# Patient Record
Sex: Female | Born: 1950
Health system: Southern US, Community
[De-identification: ages and names within clinical notes are randomized; demographics above are authoritative.]

## PROBLEM LIST (undated history)

## (undated) DIAGNOSIS — E785 Hyperlipidemia, unspecified: Secondary | ICD-10-CM

## (undated) DIAGNOSIS — C801 Malignant (primary) neoplasm, unspecified: Secondary | ICD-10-CM

## (undated) DIAGNOSIS — I10 Essential (primary) hypertension: Secondary | ICD-10-CM

## (undated) HISTORY — DX: Malignant (primary) neoplasm, unspecified: C80.1

## (undated) HISTORY — PX: EYE SURGERY: SHX253

## (undated) HISTORY — DX: Hyperlipidemia, unspecified: E78.5

## (undated) HISTORY — PX: BREAST SURGERY: SHX581

## (undated) HISTORY — PX: ABDOMINAL HYSTERECTOMY: SHX81

---

## 2020-02-11 ENCOUNTER — Ambulatory Visit: Payer: Medicare Other | Admitting: Nurse Practitioner

## 2020-03-03 ENCOUNTER — Ambulatory Visit (INDEPENDENT_AMBULATORY_CARE_PROVIDER_SITE_OTHER): Payer: Medicare Other | Admitting: Family Medicine

## 2020-03-03 ENCOUNTER — Other Ambulatory Visit: Payer: Self-pay

## 2020-03-03 ENCOUNTER — Encounter: Payer: Self-pay | Admitting: Family Medicine

## 2020-03-03 VITALS — BP 100/60 | HR 60 | Temp 98.2°F | Ht 66.0 in | Wt 242.6 lb

## 2020-03-03 DIAGNOSIS — Z0000 Encounter for general adult medical examination without abnormal findings: Secondary | ICD-10-CM | POA: Diagnosis not present

## 2020-03-03 DIAGNOSIS — R011 Cardiac murmur, unspecified: Secondary | ICD-10-CM

## 2020-03-03 DIAGNOSIS — M810 Age-related osteoporosis without current pathological fracture: Secondary | ICD-10-CM

## 2020-03-03 DIAGNOSIS — Z1231 Encounter for screening mammogram for malignant neoplasm of breast: Secondary | ICD-10-CM

## 2020-03-03 DIAGNOSIS — R300 Dysuria: Secondary | ICD-10-CM

## 2020-03-03 DIAGNOSIS — E7800 Pure hypercholesterolemia, unspecified: Secondary | ICD-10-CM

## 2020-03-03 NOTE — Progress Notes (Signed)
Subjective:    Patient ID: Christina Phillips, female    DOB: Dec 29, 1950, 69 y.o.   MRN: [id number]  HPI Patient is a very pleasant 69 year old Caucasian female who presents today to establish care.  She states that she had a history of breast cancer originally treated when she was in her late 90s.  It was a treated originally in her right breast with a lumpectomy and radiation.  However it returned and she underwent a right-sided mastectomy.  She also took Arimidex for 6 years.  However she has been off Arimidex now for more than a year.  She is due for her mammogram.  She also has never had a colonoscopy.  She declines a colonoscopy but she would consent to Cologuard.  Past medical history is also significant for a total abdominal hysterectomy for a "suspicious lesion".  Patient is not sure if it was cancerous however her previous gynecologist was monitoring a lesion inside her vagina annually with a pelvic exam.  She would like to establish with a gynecologist here.  She also has a history of osteoporosis and is currently on Prolia twice yearly.  She is overdue for her most recent Prolia injection as well as a bone density test. Past Medical History:  Diagnosis Date  . Cancer [facility])    breast right, mastectomy after recurrence from lumpectomy/radiation  . Hyperlipidemia    Osteoporosis  PSH- r mastectomy TAH  No family history on file.  Social History   Socioeconomic History  . Marital status: Divorced    Spouse name: Not on file  . Number of children: Not on file  . Years of education: Not on file  . Highest education level: Not on file  Occupational History  . Not on file  Tobacco Use  . Smoking status: Former Research scientist (life sciences)  . Smokeless tobacco: Never Used  Substance and Sexual Activity  . Alcohol use: Never  . Drug use: Never  . Sexual activity: Not on file  Other Topics Concern  . Not on file  Social History Narrative  . Not on file   Social Determinants of Health    Financial Resource Strain:   . Difficulty of Paying Living Expenses: Not on file  Food Insecurity:   . Worried About Charity fundraiser in the Last Year: Not on file  . Ran Out of Food in the Last Year: Not on file  Transportation Needs:   . Lack of Transportation (Medical): Not on file  . Lack of Transportation (Non-Medical): Not on file  Physical Activity:   . Days of Exercise per Week: Not on file  . Minutes of Exercise per Session: Not on file  Stress:   . Feeling of Stress : Not on file  Social Connections:   . Frequency of Communication with Friends and Family: Not on file  . Frequency of Social Gatherings with Friends and Family: Not on file  . Attends Religious Services: Not on file  . Active Member of Clubs or Organizations: Not on file  . Attends Archivist Meetings: Not on file  . Marital Status: Not on file  Intimate Partner Violence:   . Fear of Current or Ex-Partner: Not on file  . Emotionally Abused: Not on file  . Physically Abused: Not on file  . Sexually Abused: Not on file      Review of Systems  All other systems reviewed and are negative.      Objective:   Physical Exam Vitals reviewed.  Constitutional:      General: She is not in acute distress.    Appearance: Normal appearance. She is obese. She is not ill-appearing, toxic-appearing or diaphoretic.  HENT:     Head: Normocephalic and atraumatic.     Right Ear: Tympanic membrane, ear canal and external ear normal. There is no impacted cerumen.     Left Ear: Tympanic membrane, ear canal and external ear normal. There is no impacted cerumen.     Nose: Nose normal. No congestion or rhinorrhea.     Mouth/Throat:     Mouth: Mucous membranes are moist.     Pharynx: Oropharynx is clear. No oropharyngeal exudate or posterior oropharyngeal erythema.  Eyes:     General: No scleral icterus.       Right eye: No discharge.        Left eye: No discharge.     Extraocular Movements: Extraocular  movements intact.     Conjunctiva/sclera: Conjunctivae normal.     Pupils: Pupils are equal, round, and reactive to light.  Neck:     Vascular: No carotid bruit.  Cardiovascular:     Rate and Rhythm: Normal rate and regular rhythm.     Heart sounds: Murmur heard.  No friction rub. No gallop.   Pulmonary:     Effort: Pulmonary effort is normal. No respiratory distress.     Breath sounds: Normal breath sounds. No stridor. No wheezing, rhonchi or rales.  Chest:     Chest wall: No tenderness.  Abdominal:     General: Abdomen is flat. Bowel sounds are normal. There is no distension.     Palpations: Abdomen is soft. There is no mass.     Tenderness: There is no abdominal tenderness. There is no right CVA tenderness, left CVA tenderness, guarding or rebound.     Hernia: No hernia is present.  Musculoskeletal:     Cervical back: Normal range of motion and neck supple.     Right lower leg: No edema.     Left lower leg: No edema.  Lymphadenopathy:     Cervical: No cervical adenopathy.  Skin:    General: Skin is warm.     Coloration: Skin is not jaundiced or pale.     Findings: No bruising, erythema, lesion or rash.  Neurological:     General: No focal deficit present.     Mental Status: She is alert and oriented to person, place, and time. Mental status is at baseline.     Cranial Nerves: No cranial nerve deficit.     Sensory: No sensory deficit.     Motor: No weakness.     Coordination: Coordination normal.     Gait: Gait normal.     Deep Tendon Reflexes: Reflexes normal.  Psychiatric:        Mood and Affect: Mood normal.        Behavior: Behavior normal.        Thought Content: Thought content normal.        Judgment: Judgment normal.           Assessment & Plan:  Encounter for screening mammogram for malignant neoplasm of breast - Plan: MM Digital Screening  Osteoporosis, unspecified osteoporosis type, unspecified pathological fracture presence - Plan: DG Bone  Density  General medical exam - Plan: Ambulatory referral to Gynecology  Dysuria - Plan: Urinalysis, Routine w reflex microscopic  Pure hypercholesterolemia - Plan: CBC with Differential/Platelet, COMPLETE METABOLIC PANEL WITH GFR, Lipid panel  Murmur, cardiac - Plan:  ECHOCARDIOGRAM COMPLETE  Patient has an undiagnosed cardiac murmur.  I will schedule her for an echocardiogram to evaluate further.  She has a history of osteoporosis.  I will schedule her for a bone density to obtain a baseline and then schedule her for Prolia every 6 months.  I will schedule the patient for a mammogram.  She would also like to meet with a gynecologist for a pelvic exam however she does not require a Pap smear.  Check CBC, CMP, fasting lipid panel.  Patient does not want a colonoscopy but will consent to Cologuard.  She denies any history of falls, depression, or memory loss.

## 2020-03-03 NOTE — Patient Instructions (Signed)
° ° ° °  If you have lab work done today you will be contacted with your lab results within the next 2 weeks.  If you have not heard from us then please contact us. The fastest way to get your results is to register for My Chart. ° ° °IF you received an x-ray today, you will receive an invoice from [facility]. Please contact  Radiology at [phone] with questions or concerns regarding your invoice.  ° °IF you received labwork today, you will receive an invoice from [facility]. Please contact [facility] at [phone] with questions or concerns regarding your invoice.  ° °Our billing staff will not be able to assist you with questions regarding bills from these companies. ° °You will be contacted with the lab results as soon as they are available. The fastest way to get your results is to activate your My Chart account. Instructions are located on the last page of this paperwork. If you have not heard from us regarding the results in 2 weeks, please contact this office. °  ° ° ° °

## 2020-03-04 LAB — COMPLETE METABOLIC PANEL WITH GFR
AG Ratio: 1.9 (calc) (ref 1.0–2.5)
ALT: 16 U/L (ref 6–29)
AST: 17 U/L (ref 10–35)
Albumin: 4.6 g/dL (ref 3.6–5.1)
Alkaline phosphatase (APISO): 57 U/L (ref 37–153)
BUN: 18 mg/dL (ref 7–25)
CO2: 28 mmol/L (ref 20–32)
Calcium: 10.3 mg/dL (ref 8.6–10.4)
Chloride: 100 mmol/L (ref 98–110)
Creat: 0.85 mg/dL (ref 0.50–0.99)
GFR, Est African American: 82 mL/min/{1.73_m2} (ref >=60)
GFR, Est Non African American: 71 mL/min/{1.73_m2} (ref >=60)
Globulin: 2.4 g/dL (calc) (ref 1.9–3.7)
Glucose, Bld: 72 mg/dL (ref 65–99)
Potassium: 4.3 mmol/L (ref 3.5–5.3)
Sodium: 140 mmol/L (ref 135–146)
Total Bilirubin: 0.5 mg/dL (ref 0.2–1.2)
Total Protein: 7 g/dL (ref 6.1–8.1)

## 2020-03-04 LAB — URINALYSIS, ROUTINE W REFLEX MICROSCOPIC
Bilirubin Urine: NEGATIVE
Glucose, UA: NEGATIVE
Hgb urine dipstick: NEGATIVE
Ketones, ur: NEGATIVE
Leukocytes,Ua: NEGATIVE
Nitrite: NEGATIVE
Protein, ur: NEGATIVE
Specific Gravity, Urine: 1.011 (ref 1.001–1.03)
pH: 7 (ref 5.0–8.0)

## 2020-03-04 LAB — LIPID PANEL
Cholesterol: 186 mg/dL (ref <200)
HDL: 49 mg/dL — ABNORMAL LOW (ref >=50)
LDL Cholesterol (Calc): 105 mg/dL (calc) — ABNORMAL HIGH
Non-HDL Cholesterol (Calc): 137 mg/dL (calc) — ABNORMAL HIGH (ref <130)
Total CHOL/HDL Ratio: 3.8 (calc) (ref <5.0)
Triglycerides: 199 mg/dL — ABNORMAL HIGH (ref <150)

## 2020-03-04 LAB — CBC WITH DIFFERENTIAL/PLATELET
Absolute Monocytes: 470 cells/uL (ref 200–950)
Basophils Absolute: 41 cells/uL (ref 0–200)
Basophils Relative: 0.7 %
Eosinophils Absolute: 151 cells/uL (ref 15–500)
Eosinophils Relative: 2.6 %
HCT: 44.6 % (ref 35.0–45.0)
Hemoglobin: 14.8 g/dL (ref 11.7–15.5)
Lymphs Abs: 1607 cells/uL (ref 850–3900)
MCH: 30.5 pg (ref 27.0–33.0)
MCHC: 33.2 g/dL (ref 32.0–36.0)
MCV: 91.8 fL (ref 80.0–100.0)
MPV: 10.3 fL (ref 7.5–12.5)
Monocytes Relative: 8.1 %
Neutro Abs: 3532 cells/uL (ref 1500–7800)
Neutrophils Relative %: 60.9 %
Platelets: 229 10*3/uL (ref 140–400)
RBC: 4.86 10*6/uL (ref 3.80–5.10)
RDW: 12.3 % (ref 11.0–15.0)
Total Lymphocyte: 27.7 %
WBC: 5.8 10*3/uL (ref 3.8–10.8)

## 2020-03-18 ENCOUNTER — Other Ambulatory Visit: Payer: Self-pay

## 2020-03-18 ENCOUNTER — Ambulatory Visit (HOSPITAL_COMMUNITY): Payer: Medicare Other | Attending: Cardiovascular Disease

## 2020-03-18 DIAGNOSIS — R011 Cardiac murmur, unspecified: Secondary | ICD-10-CM | POA: Diagnosis present

## 2020-03-18 LAB — ECHOCARDIOGRAM COMPLETE
Area-P 1/2: 4.06 cm2
S' Lateral: 2.9 cm

## 2020-03-27 ENCOUNTER — Encounter: Payer: Medicare Other | Admitting: Obstetrics & Gynecology

## 2020-04-04 ENCOUNTER — Telehealth: Payer: Self-pay

## 2020-04-04 ENCOUNTER — Other Ambulatory Visit: Payer: Self-pay

## 2020-04-04 DIAGNOSIS — E7800 Pure hypercholesterolemia, unspecified: Secondary | ICD-10-CM

## 2020-04-04 MED ORDER — PRAVASTATIN SODIUM 10 MG PO TABS: 10.0000 mg | ORAL_TABLET | Freq: Every day | ORAL | 3 refills | Status: DC

## 2020-04-04 NOTE — Telephone Encounter (Signed)
New rx Provider prescribed has been sent to Pt pharmacy

## 2020-04-04 NOTE — Telephone Encounter (Signed)
Pt would like to try another cholesterol medication, she's not doing well with the side affects.  Left arm pain is the only thing bother her.

## 2020-04-04 NOTE — Telephone Encounter (Signed)
Sure, she can try pravastatin 10 mg a day.

## 2020-04-15 ENCOUNTER — Other Ambulatory Visit: Payer: Self-pay

## 2020-04-15 ENCOUNTER — Ambulatory Visit (INDEPENDENT_AMBULATORY_CARE_PROVIDER_SITE_OTHER): Payer: Medicare Other | Admitting: Obstetrics & Gynecology

## 2020-04-15 ENCOUNTER — Encounter: Payer: Self-pay | Admitting: Obstetrics & Gynecology

## 2020-04-15 VITALS — BP 160/94 | HR 80 | Ht 66.0 in | Wt 246.5 lb

## 2020-04-15 DIAGNOSIS — Z01419 Encounter for gynecological examination (general) (routine) without abnormal findings: Secondary | ICD-10-CM | POA: Diagnosis not present

## 2020-04-15 NOTE — Progress Notes (Signed)
Subjective:     Christina Phillips is a 69 y.o. female here for a routine exam.  No LMP recorded. Patient has had a hysterectomy. [id number] Birth Control Method:  hysterectomy Menstrual Calendar(currently): n/a  Current complaints: none.   Current acute medical issues:  none   Recent Gynecologic History No LMP recorded. Patient has had a hysterectomy. Last Pap: n/a,   Last mammogram: 2020,    Past Medical History:  Diagnosis Date  . Cancer [facility])    breast right, mastectomy after recurrence from lumpectomy/radiation  . Hyperlipidemia     Past Surgical History:  Procedure Laterality Date  . ABDOMINAL HYSTERECTOMY N/A    suspicious lesion  . BREAST SURGERY N/A    Phreesia 02/29/2020    OB History    Gravida  3   Para  2   Term  2   Preterm      AB  1   Living  2     SAB  1   TAB      Ectopic      Multiple      Live Births  2           Social History   Socioeconomic History  . Marital status: Divorced    Spouse name: Not on file  . Number of children: 2  . Years of education: Not on file  . Highest education level: Not on file  Occupational History  . Not on file  Tobacco Use  . Smoking status: Former Research scientist (life sciences)  . Smokeless tobacco: Never Used  Vaping Use  . Vaping Use: Never used  Substance and Sexual Activity  . Alcohol use: Yes    Comment: occas  . Drug use: Never  . Sexual activity: Not Currently    Birth control/protection: Surgical  Other Topics Concern  . Not on file  Social History Narrative  . Not on file   Social Determinants of Health   Financial Resource Strain: Medium Risk  . Difficulty of Paying Living Expenses: Somewhat hard  Food Insecurity: No Food Insecurity  . Worried About Charity fundraiser in the Last Year: Never true  . Ran Out of Food in the Last Year: Never true  Transportation Needs: No Transportation Needs  . Lack of Transportation (Medical): No  . Lack of Transportation (Non-Medical): No  Physical  Activity: Sufficiently Active  . Days of Exercise per Week: 7 days  . Minutes of Exercise per Session: 40 min  Stress: No Stress Concern Present  . Feeling of Stress : Only a little  Social Connections: Socially Isolated  . Frequency of Communication with Friends and Family: Once a week  . Frequency of Social Gatherings with Friends and Family: Once a week  . Attends Religious Services: 1 to 4 times per year  . Active Member of Clubs or Organizations: No  . Attends Archivist Meetings: Never  . Marital Status: Divorced    Family History  Problem Relation Age of Onset  . Cancer Father   . Hypertension Mother   . Cancer Brother   . Heart attack Sister      Current Outpatient Medications:  .  atorvastatin (LIPITOR) 10 MG tablet, Take 10 mg by mouth daily., Disp: , Rfl:  .  Multiple Vitamins-Minerals (CENTRUM ADULTS PO), Take by mouth., Disp: , Rfl:   Review of Systems  Review of Systems  Constitutional: Negative for fever, chills, weight loss, malaise/fatigue and diaphoresis.  HENT: Negative for hearing loss, ear pain,  nosebleeds, congestion, sore throat, neck pain, tinnitus and ear discharge.   Eyes: Negative for blurred vision, double vision, photophobia, pain, discharge and redness.  Respiratory: Negative for cough, hemoptysis, sputum production, shortness of breath, wheezing and stridor.   Cardiovascular: Negative for chest pain, palpitations, orthopnea, claudication, leg swelling and PND.  Gastrointestinal: negative for abdominal pain. Negative for heartburn, nausea, vomiting, diarrhea, constipation, blood in stool and melena.  Genitourinary: Negative for dysuria, urgency, frequency, hematuria and flank pain.  Musculoskeletal: Negative for myalgias, back pain, joint pain and falls.  Skin: Negative for itching and rash.  Neurological: Negative for dizziness, tingling, tremors, sensory change, speech change, focal weakness, seizures, loss of consciousness, weakness  and headaches.  Endo/Heme/Allergies: Negative for environmental allergies and polydipsia. Does not bruise/bleed easily.  Psychiatric/Behavioral: Negative for depression, suicidal ideas, hallucinations, memory loss and substance abuse. The patient is not nervous/anxious and does not have insomnia.        Objective:  Blood pressure (!) 160/94, pulse 80, height 5\' 6"  (1.676 m), weight 246 lb 8 oz (111.8 kg).   Physical Exam  Vitals reviewed. Constitutional: She is oriented to person, place, and time. She appears well-developed and well-nourished.  HENT:  Head: Normocephalic and atraumatic.        Right Ear: External ear normal.  Left Ear: External ear normal.  Nose: Nose normal.  Mouth/Throat: Oropharynx is clear and moist.  Eyes: Conjunctivae and EOM are normal. Pupils are equal, round, and reactive to light. Right eye exhibits no discharge. Left eye exhibits no discharge. No scleral icterus.  Neck: Normal range of motion. Neck supple. No tracheal deviation present. No thyromegaly present.  Cardiovascular: Normal rate, regular rhythm, normal heart sounds and intact distal pulses.  Exam reveals no gallop and no friction rub.   No murmur heard. Respiratory: Effort normal and breath sounds normal. No respiratory distress. She has no wheezes. She has no rales. She exhibits no tenderness.  GI: Soft. Bowel sounds are normal. She exhibits no distension and no mass. There is no tenderness. There is no rebound and no guarding.  Genitourinary:  Breasts no masses skin changes or nipple changes bilaterally      Vulva is normal without lesions Vagina is pink moist without discharge Cervix absent Uterus is negative Adnexa is negative  {Rectal    hemoccult negative, normal tone, no masses  Musculoskeletal: Normal range of motion. She exhibits no edema and no tenderness.  Neurological: She is alert and oriented to person, place, and time. She has normal reflexes. She displays normal reflexes. No  cranial nerve deficit. She exhibits normal muscle tone. Coordination normal.  Skin: Skin is warm and dry. No rash noted. No erythema. No pallor.  Psychiatric: She has a normal mood and affect. Her behavior is normal. Judgment and thought content normal.       Medications Ordered at today's visit: No orders of the defined types were placed in this encounter.   Other orders placed at today's visit: No orders of the defined types were placed in this encounter.     Assessment:    Normal Gyn exam.    Plan:    Mammogram ordered. Follow up in: 3 years.     Return in about 3 years (around 04/16/2023) for yearly.

## 2020-06-10 ENCOUNTER — Ambulatory Visit (INDEPENDENT_AMBULATORY_CARE_PROVIDER_SITE_OTHER): Payer: Medicare Other

## 2020-06-10 ENCOUNTER — Other Ambulatory Visit: Payer: Self-pay

## 2020-06-10 ENCOUNTER — Ambulatory Visit
Admission: EM | Admit: 2020-06-10 | Discharge: 2020-06-10 | Disposition: A | Discharge status: Home / Self Care | Payer: Medicare Other | Attending: Emergency Medicine | Admitting: Emergency Medicine

## 2020-06-10 DIAGNOSIS — M25551 Pain in right hip: Secondary | ICD-10-CM

## 2020-06-10 DIAGNOSIS — W19XXXA Unspecified fall, initial encounter: Secondary | ICD-10-CM

## 2020-06-10 DIAGNOSIS — M5441 Lumbago with sciatica, right side: Secondary | ICD-10-CM

## 2020-06-10 MED ORDER — NAPROXEN 500 MG PO TABS: 500.0000 mg | ORAL_TABLET | Freq: Two times a day (BID) | ORAL | 0 refills | Status: DC

## 2020-06-10 MED ORDER — TIZANIDINE HCL 2 MG PO TABS: 2.0000 mg | ORAL_TABLET | Freq: Four times a day (QID) | ORAL | 0 refills | Status: AC | PRN

## 2020-06-10 NOTE — ED Provider Notes (Signed)
[facility]    CSN: [id number] Arrival date & time: 06/10/20  1332      History   Chief Complaint Chief Complaint  Patient presents with  . Back Pain    HPI Christina Phillips is a 70 y.o. female  With history as below presenting for right low back pain with some sciatica down to her mid thigh.  States that she fell on ice last Thursday.  No head trauma, LOC.  Denies known history of osteoporosis or osteopenia.  Is s/p right lumpectomy and radiation.  No active therapy at this time.  Requesting x-ray.  Past Medical History:  Diagnosis Date  . Cancer [facility])    breast right, mastectomy after recurrence from lumpectomy/radiation  . Hyperlipidemia     There are no problems to display for this patient.   Past Surgical History:  Procedure Laterality Date  . ABDOMINAL HYSTERECTOMY N/A    suspicious lesion  . BREAST SURGERY N/A    [employer]reesia 02/29/2020    OB History    Gravida  3   Para  2   Term  2   Preterm      AB  1   Living  2     SAB  1   IAB      Ectopic      Multiple      Live Births  2            Home Medications    Prior to Admission medications   Medication Sig Start Date End Date Taking? Authorizing Provider  naproxen (NAPROSYN) 500 MG tablet Take 1 tablet (500 mg total) by mouth 2 (two) times daily. 06/10/20  Yes Hall-Potvin, [place], PA-C  tiZANidine (ZANAFLEX) 2 MG tablet Take 1 tablet (2 mg total) by mouth every 6 (six) hours as needed for up to 5 days for muscle spasms. 06/10/20 06/15/20 Yes Hall-Potvin, [place], PA-C  atorvastatin (LIPITOR) 10 MG tablet Take 10 mg by mouth daily.    [provider]  Multiple Vitamins-Minerals (CENTRUM ADULTS PO) Take by mouth.    [provider]    Family History Family History  Problem Relation Age of Onset  . Cancer Father   . Hypertension Mother   . Cancer Brother   . Heart attack Sister     Social History Social History   Tobacco Use  . Smoking  status: Former Research scientist (life sciences)  . Smokeless tobacco: Never Used  Vaping Use  . Vaping Use: Never used  Substance Use Topics  . Alcohol use: Yes    Comment: occas  . Drug use: Never     Allergies   Patient has no known allergies.   Review of Systems Review of Systems  Constitutional: Negative for fatigue and fever.  HENT: Negative for ear pain, sinus pain, sore throat and voice change.   Eyes: Negative for pain, redness and visual disturbance.  Respiratory: Negative for cough and shortness of breath.   Cardiovascular: Negative for chest pain and palpitations.  Gastrointestinal: Negative for abdominal pain, diarrhea and vomiting.  Musculoskeletal: Positive for back pain. Negative for arthralgias and myalgias.  Skin: Negative for rash and wound.  Neurological: Negative for syncope and headaches.     Physical Exam Triage Vital Signs ED Triage Vitals [06/10/20 1419]  Enc Vitals Group     BP (!) 179/98     Pulse Rate 89     Resp 18     Temp 98.5 F (36.9 C)     Temp  Source Oral     SpO2 96 %     Weight      Height      Head Circumference      Peak Flow      Pain Score 6     Pain Loc      Pain Edu?      Excl. in [place]?    No data found.  Updated Vital Signs BP (!) 179/98 (BP Location: Left Arm)   Pulse 89   Temp 98.5 F (36.9 C) (Oral)   Resp 18   SpO2 96%   Visual Acuity Right Eye Distance:   Left Eye Distance:   Bilateral Distance:    Right Eye Near:   Left Eye Near:    Bilateral Near:     Physical Exam Constitutional:      General: She is not in acute distress. HENT:     Head: Normocephalic and atraumatic.  Eyes:     General: No scleral icterus.    Pupils: Pupils are equal, round, and reactive to light.  Cardiovascular:     Rate and Rhythm: Normal rate.  Pulmonary:     Effort: Pulmonary effort is normal.  Musculoskeletal:       Back:  Skin:    Coloration: Skin is not jaundiced or pale.  Neurological:     Mental Status: She is alert and oriented to  person, place, and time.      UC Treatments / Results  Labs (all labs ordered are listed, but only abnormal results are displayed) Labs Reviewed - No data to display  EKG   Radiology DG Hip Unilat With Pelvis 2-3 Views Right  Result Date: 06/10/2020 CLINICAL DATA:  Right hip pain after almost falling on 06/05/2020 EXAM: DG HIP (WITH OR WITHOUT PELVIS) 2-3V RIGHT COMPARISON:  None FINDINGS: There is no evidence of hip fracture or dislocation. There is no evidence of arthropathy or other focal bone abnormality. IMPRESSION: Negative. Electronically Signed   By: Kerby Moors M.D.   On: 06/10/2020 15:04    Procedures Procedures (including critical care time)  Medications Ordered in UC Medications - No data to display  Initial Impression / Assessment and Plan / UC Course  I have reviewed the triage vital signs and the nursing notes.  Pertinent labs & imaging results that were available during my care of the patient were reviewed by me and considered in my medical decision making (see chart for details).     X-ray negative, will treat supportively as below, follow-up with Ortho if needed.  Return precautions discussed, pt verbalized understanding and is agreeable to plan. Final Clinical Impressions(s) / UC Diagnoses   Final diagnoses:  Fall  Acute right-sided low back pain with right-sided sciatica   Discharge Instructions   None    ED Prescriptions    Medication Sig Dispense Auth. Provider   naproxen (NAPROSYN) 500 MG tablet Take 1 tablet (500 mg total) by mouth 2 (two) times daily. 30 tablet Hall-Potvin, [place], PA-C   tiZANidine (ZANAFLEX) 2 MG tablet Take 1 tablet (2 mg total) by mouth every 6 (six) hours as needed for up to 5 days for muscle spasms. 20 tablet Hall-Potvin, [place], PA-C     PDMP not reviewed this encounter.   Hall-Potvin, [place], [place] 06/10/20 1523

## 2020-06-10 NOTE — ED Triage Notes (Signed)
Pt states slipped on ice last Thursday not falling but pulled a muscle. Pt c/o rt side lower back pain radiating to rt hip.

## 2020-06-11 ENCOUNTER — Telehealth: Payer: Self-pay | Admitting: Family Medicine

## 2020-06-11 NOTE — Telephone Encounter (Signed)
Called and spoke with Pt. She said that she went to [facility] and does not need to be seen.

## 2020-06-11 NOTE — Telephone Encounter (Signed)
Pt called saying that she threw her back out, wants to call and schedule an appt with Dr. Dennard Schaumann  cb [phone]

## 2020-07-04 ENCOUNTER — Other Ambulatory Visit: Payer: Self-pay | Admitting: Family Medicine

## 2020-07-08 ENCOUNTER — Encounter: Payer: Self-pay | Admitting: Emergency Medicine

## 2020-07-08 ENCOUNTER — Other Ambulatory Visit: Payer: Self-pay

## 2020-07-08 ENCOUNTER — Ambulatory Visit (INDEPENDENT_AMBULATORY_CARE_PROVIDER_SITE_OTHER): Payer: Medicare Other

## 2020-07-08 ENCOUNTER — Ambulatory Visit
Admission: EM | Admit: 2020-07-08 | Discharge: 2020-07-08 | Disposition: A | Discharge status: Home / Self Care | Payer: Medicare Other | Attending: Family Medicine | Admitting: Family Medicine

## 2020-07-08 ENCOUNTER — Ambulatory Visit: Payer: Medicare Other

## 2020-07-08 DIAGNOSIS — M25561 Pain in right knee: Secondary | ICD-10-CM | POA: Diagnosis not present

## 2020-07-08 DIAGNOSIS — M79604 Pain in right leg: Secondary | ICD-10-CM | POA: Diagnosis not present

## 2020-07-08 MED ORDER — TIZANIDINE HCL 2 MG PO TABS: 4.0000 mg | ORAL_TABLET | Freq: Three times a day (TID) | ORAL | 0 refills | Status: DC | PRN

## 2020-07-08 MED ORDER — NAPROXEN 375 MG PO TABS: 375.0000 mg | ORAL_TABLET | Freq: Two times a day (BID) | ORAL | 0 refills | Status: DC

## 2020-07-08 NOTE — ED Triage Notes (Addendum)
Patient c/o RT lower leg pain x 2 days.   Patient denies any fall or trauma.   Patient endorses that pain is present on "the front, RT side of shin".   Patient endorses that pain is worsened by standing up and walking. Patient states pain is relieved by sitting down.   Patient denies any previous history of similar symptoms.   Patient has taken OTC Naproxen w/ no relief of symptoms.

## 2020-07-08 NOTE — Discharge Instructions (Addendum)
Imaging of the knee still pending I will notify you once those results have been made available via MyChart.  In the meantime for acute knee pain take naproxen 375 2 times daily as needed for knee pain and for acute pain tizanidine 2-4 mg every 8 hours. If pain continues following treatment recommend follow-up with sports medicine provider for additional treatment and management.  I have put Dr. Raeford Razor  contact information on your discharge paperwork.

## 2020-07-08 NOTE — ED Provider Notes (Signed)
[facility]    CSN: [id number] Arrival date & time: 07/08/20  1333      History   Chief Complaint Chief Complaint  Patient presents with  . Leg Pain    HPI Christina Phillips is a 70 y.o. female.   HPI Patient presents today with acute right knee pain is radiating down her right leg causing significant pain with walking and with bending of the right leg.  Patient denies any swelling and denies any recent injury however has a distant history of a fall several years ago.  She has no history of recurrent knee pain.  Has been taking over-the-counter ibuprofen without relief.  She is intolerant of steroids. Past Medical History:  Diagnosis Date  . Cancer [facility])    breast right, mastectomy after recurrence from lumpectomy/radiation  . Hyperlipidemia     There are no problems to display for this patient.   Past Surgical History:  Procedure Laterality Date  . ABDOMINAL HYSTERECTOMY N/A    suspicious lesion  . BREAST SURGERY N/A    Phreesia 02/29/2020    OB History    Gravida  3   Para  2   Term  2   Preterm      AB  1   Living  2     SAB  1   IAB      Ectopic      Multiple      Live Births  2            Home Medications    Prior to Admission medications   Medication Sig Start Date End Date Taking? Authorizing Provider  atorvastatin (LIPITOR) 10 MG tablet Take 10 mg by mouth daily.   Yes [provider]  Multiple Vitamins-Minerals (CENTRUM ADULTS PO) Take by mouth.   Yes [provider]  naproxen (NAPROSYN) 500 MG tablet TAKE 1 TABLET BY MOUTH TWICE A DAY 07/04/20  Yes Susy Frizzle, MD    Family History Family History  Problem Relation Age of Onset  . Cancer Father   . Hypertension Mother   . Cancer Brother   . Heart attack Sister     Social History Social History   Tobacco Use  . Smoking status: Former Research scientist (life sciences)  . Smokeless tobacco: Never Used  Vaping Use  . Vaping Use: Never used  Substance  Use Topics  . Alcohol use: Yes    Comment: occas  . Drug use: Never     Allergies   Patient has no known allergies.   Review of Systems Review of Systems Pertinent negatives listed in HPI  Physical Exam Triage Vital Signs ED Triage Vitals  Enc Vitals Group     BP 07/08/20 1456 125/69     Pulse Rate 07/08/20 1456 74     Resp 07/08/20 1456 16     Temp 07/08/20 1456 98.7 F (37.1 C)     Temp Source 07/08/20 1456 Oral     SpO2 07/08/20 1456 96 %     Weight --      Height --      Head Circumference --      Peak Flow --      Pain Score 07/08/20 1453 10     Pain Loc --      Pain Edu? --      Excl. in [place]? --    No data found.  Updated Vital Signs BP 125/69 (BP Location: Left Arm)   Pulse 74  Temp 98.7 F (37.1 C) (Oral)   Resp 16   SpO2 96%   Visual Acuity Right Eye Distance:   Left Eye Distance:   Bilateral Distance:    Right Eye Near:   Left Eye Near:    Bilateral Near:     Physical Exam Constitutional:      Appearance: She is obese.  Cardiovascular:     Rate and Rhythm: Normal rate and regular rhythm.  Pulmonary:     Effort: Pulmonary effort is normal.     Breath sounds: Normal breath sounds.  Musculoskeletal:     Cervical back: Normal range of motion and neck supple.     Comments: Right knee no visible deformity present. Positive for movement pain  Skin:    Capillary Refill: Capillary refill takes less than 2 seconds.  Neurological:     General: No focal deficit present.     Mental Status: She is alert and oriented to person, place, and time.  Psychiatric:        Mood and Affect: Mood normal.        Behavior: Behavior normal.      UC Treatments / Results  Labs (all labs ordered are listed, but only abnormal results are displayed) Labs Reviewed - No data to display  EKG   Radiology No results found.  Procedures Procedures (including critical care time)  Medications Ordered in UC Medications - No data to display  Initial  Impression / Assessment and Plan / UC Course  I have reviewed the triage vital signs and the nursing notes.  Pertinent labs & imaging results that were available during my care of the patient were reviewed by me and considered in my medical decision making (see chart for details).    Imaging of the right knee significant for acute arthritic changes.  Patient notified of imaging results.  My chart as she was discharged prior to imaging availability.  Treatment prescribed prior to discharge naproxen 375 twice daily and tizanidine 2 to 4 mg as needed for pain.  My chart message did indicate the need to follow-up with orthopedics for further work-up and evaluation and management of chronic arthritis related to right knee. Final Clinical Impressions(s) / UC Diagnoses   Final diagnoses:  Acute pain of right knee  Right leg pain     Discharge Instructions     Imaging of the knee still pending I will notify you once those results have been made available via MyChart.  In the meantime for acute knee pain take naproxen 375 2 times daily as needed for knee pain and for acute pain tizanidine 2-4 mg every 8 hours. If pain continues following treatment recommend follow-up with sports medicine provider for additional treatment and management.  I have put Dr. Raeford Razor  contact information on your discharge paperwork.    ED Prescriptions    Medication Sig Dispense Auth. Provider   naproxen (NAPROSYN) 375 MG tablet Take 1 tablet (375 mg total) by mouth 2 (two) times daily. 20 tablet Scot Jun, FNP   tiZANidine (ZANAFLEX) 2 MG tablet Take 2 tablets (4 mg total) by mouth every 8 (eight) hours as needed for muscle spasms. 20 tablet Scot Jun, FNP     PDMP not reviewed this encounter.   Scot Jun, FNP 07/11/20 1251

## 2020-07-14 ENCOUNTER — Ambulatory Visit (INDEPENDENT_AMBULATORY_CARE_PROVIDER_SITE_OTHER): Payer: Medicare Other | Admitting: Physician Assistant

## 2020-07-14 ENCOUNTER — Encounter: Payer: Self-pay | Admitting: Physician Assistant

## 2020-07-14 VITALS — Ht 64.0 in | Wt 241.4 lb

## 2020-07-14 DIAGNOSIS — M1711 Unilateral primary osteoarthritis, right knee: Secondary | ICD-10-CM | POA: Diagnosis not present

## 2020-07-14 MED ORDER — LIDOCAINE HCL 1 % IJ SOLN
3.0000 mL | INTRAMUSCULAR | Status: AC | PRN
  Administered 2020-07-14: 3 mL

## 2020-07-14 MED ORDER — METHYLPREDNISOLONE ACETATE 40 MG/ML IJ SUSP
40.0000 mg | INTRAMUSCULAR | Status: AC | PRN
  Administered 2020-07-14: 40 mg via INTRA_ARTICULAR

## 2020-07-14 NOTE — Progress Notes (Signed)
Office Visit Note   Patient: Christina Phillips           Date of Birth: [birth year]           MRN: [id number] Visit Date: 07/14/2020              Requested by: Susy Frizzle, MD [id number] [place],  [place] [address] PCP: Susy Frizzle, MD   Assessment & Plan: Visit Diagnoses:  1. Primary osteoarthritis of right knee     Plan: Discussed with her quad strengthening and knee friendly exercises.  Recommended cortisone injection as she has had no treatment.  She was agreeable.  Patient tolerated injection well today.  Is to follow-up with [place] as needed.  Questions were encouraged and answered at length.  Discussed with her that she can have cortisone injections if they work well no more frequent than every 3 months.  Follow-Up Instructions: Return if symptoms worsen or fail to improve.   Orders:  Orders Placed This Encounter  Procedures  . Large Joint Inj: R knee   No orders of the defined types were placed in this encounter.     Procedures: Large Joint Inj: R knee on 07/14/2020 2:41 PM Indications: pain Details: 22 G 1.5 in needle, anterolateral approach  Arthrogram: No  Medications: 3 mL lidocaine 1 %; 40 mg methylPREDNISolone acetate 40 MG/ML Outcome: tolerated well, no immediate complications Procedure, treatment alternatives, risks and benefits explained, specific risks discussed. Consent was given by the patient. Immediately prior to procedure a time out was called to verify the correct patient, procedure, equipment, support staff and site/side marked as required. Patient was prepped and draped in the usual sterile fashion.       Clinical Data: No additional findings.   Subjective: Chief Complaint  Patient presents with  . Right Knee - Pain    HPI Christina Phillips 70 year old female comes in today with right knee pain.  She is been told she has arthritis of her knee.  She is seen at [facility] urgent care due to the pain in her knee.  She states  pain has been ongoing for the last year.  He has tried naproxen and Zanaflex with some relief.  She has had no known injury.  She has had some giving way of the knee but no other mechanical symptoms.  Knee giving way has caused her fall a few times.  Most her pain is lateral aspect of the knee. Radiographs right knee 4 views dated 07/08/2020 are personally reviewed.  There is no subluxation dislocation.  No acute fracture.  She has severe tricompartmental arthritis with bone-on-bone medial compartment and significant patellofemoral spurring.  Lateral compartment with periarticular nuclear spurs.  Review of Systems Denies any fevers chills shortness breath chest pain vaccine or ongoing infection.  Objective: Vital Signs: Ht 5\' 4"  (1.626 m)   Wt 241 lb 6.4 oz (109.5 kg)   BMI 41.44 kg/m   Physical Exam Constitutional:      Appearance: She is not ill-appearing or diaphoretic.  Pulmonary:     Effort: Pulmonary effort is normal.  Neurological:     Mental Status: She is alert and oriented to person, place, and time.  Psychiatric:        Mood and Affect: Mood normal.     Ortho Exam Left knee hyperextends slightly.  Flexion to proximal 115 degrees.  Right knee -5 to 110 degrees.  She has crepitus with bilateral knee range of motion.  Right knee tenderness along the lateral joint line.  No instability valgus varus stressing of either knee.  No abnormal warmth erythema or effusion of either knee. Specialty Comments:  No specialty comments available.  Imaging: No results found.   PMFS History: There are no problems to display for this patient.  Past Medical History:  Diagnosis Date  . Cancer [facility])    breast right, mastectomy after recurrence from lumpectomy/radiation  . Hyperlipidemia     Family History  Problem Relation Age of Onset  . Cancer Father   . Hypertension Mother   . Cancer Brother   . Heart attack Sister     Past Surgical History:  Procedure Laterality Date  .  ABDOMINAL HYSTERECTOMY N/A    suspicious lesion  . BREAST SURGERY N/A    Phreesia 02/29/2020   Social History   Occupational History  . Not on file  Tobacco Use  . Smoking status: Former Research scientist (life sciences)  . Smokeless tobacco: Never Used  Vaping Use  . Vaping Use: Never used  Substance and Sexual Activity  . Alcohol use: Yes    Comment: occas  . Drug use: Never  . Sexual activity: Not Currently    Birth control/protection: Surgical

## 2020-07-28 ENCOUNTER — Other Ambulatory Visit: Payer: Medicare Other

## 2020-07-28 ENCOUNTER — Ambulatory Visit
Admission: RE | Admit: 2020-07-28 | Discharge: 2020-07-28 | Disposition: A | Discharge status: Home / Self Care | Payer: Medicare Other | Source: Ambulatory Visit | Attending: Family Medicine | Admitting: Family Medicine

## 2020-07-28 ENCOUNTER — Other Ambulatory Visit: Payer: Self-pay

## 2020-07-28 DIAGNOSIS — Z1231 Encounter for screening mammogram for malignant neoplasm of breast: Secondary | ICD-10-CM

## 2020-08-04 ENCOUNTER — Encounter: Payer: Self-pay | Admitting: Family Medicine

## 2020-08-04 ENCOUNTER — Other Ambulatory Visit: Payer: Self-pay

## 2020-08-04 ENCOUNTER — Ambulatory Visit (INDEPENDENT_AMBULATORY_CARE_PROVIDER_SITE_OTHER): Payer: Medicare Other | Admitting: Family Medicine

## 2020-08-04 VITALS — BP 134/84 | HR 80 | Temp 98.1°F | Resp 14 | Ht 64.0 in | Wt 239.0 lb

## 2020-08-04 DIAGNOSIS — M810 Age-related osteoporosis without current pathological fracture: Secondary | ICD-10-CM

## 2020-08-04 NOTE — Progress Notes (Signed)
Subjective:    Patient ID: Christina Phillips, female    DOB: Jan 07, 1951, 70 y.o.   MRN: [id number]  HPI Patient is a very pleasant 70 year old Caucasian female who presents today to establish care.  She states that she had a history of breast cancer originally treated when she was in her late 68s.  It was a treated originally in her right breast with a lumpectomy and radiation.  However it returned and she underwent a right-sided mastectomy.  She also took Arimidex for 6 years.  However she has been off Arimidex now for more than a year.  Had a negative mammogram of left breast 3/22.  However, patient received word that she had an abnormal mammogram and schedule this appointment today to schedule a biopsy for the abnormal mammogram.  I have no documentation of any letter sent to the patient regarding an abnormal mammogram.  I reviewed the images and the report of the mammogram with the patient all of which convey no evidence of any malignancy in the left breast.  I apologized to the patient that she was given this information however it seems to have been incorrect.  She does not appear to need any type of biopsy.  She would like to get scheduled for Prolia Past Medical History:  Diagnosis Date  . Cancer [facility])    breast right, mastectomy after recurrence from lumpectomy/radiation  . Hyperlipidemia    Osteoporosis  PSH- r mastectomy TAH  Family History  Problem Relation Age of Onset  . Cancer Father   . Hypertension Mother   . Cancer Brother   . Heart attack Sister     Social History   Socioeconomic History  . Marital status: Divorced    Spouse name: Not on file  . Number of children: 2  . Years of education: Not on file  . Highest education level: Not on file  Occupational History  . Not on file  Tobacco Use  . Smoking status: Former Research scientist (life sciences)  . Smokeless tobacco: Never Used  Vaping Use  . Vaping Use: Never used  Substance and Sexual Activity  . Alcohol use: Yes     Comment: occas  . Drug use: Never  . Sexual activity: Not Currently    Birth control/protection: Surgical  Other Topics Concern  . Not on file  Social History Narrative  . Not on file   Social Determinants of Health   Financial Resource Strain: Medium Risk  . Difficulty of Paying Living Expenses: Somewhat hard  Food Insecurity: No Food Insecurity  . Worried About Charity fundraiser in the Last Year: Never true  . Ran Out of Food in the Last Year: Never true  Transportation Needs: No Transportation Needs  . Lack of Transportation (Medical): No  . Lack of Transportation (Non-Medical): No  Physical Activity: Sufficiently Active  . Days of Exercise per Week: 7 days  . Minutes of Exercise per Session: 40 min  Stress: No Stress Concern Present  . Feeling of Stress : Only a little  Social Connections: Socially Isolated  . Frequency of Communication with Friends and Family: Once a week  . Frequency of Social Gatherings with Friends and Family: Once a week  . Attends Religious Services: 1 to 4 times per year  . Active Member of Clubs or Organizations: No  . Attends Archivist Meetings: Never  . Marital Status: Divorced  Human resources officer Violence: Not At Risk  . Fear of Current or Ex-Partner: No  .  Emotionally Abused: No  . Physically Abused: No  . Sexually Abused: No      Review of Systems  All other systems reviewed and are negative.      Objective:   Physical Exam Vitals reviewed.  Constitutional:      General: She is not in acute distress.    Appearance: Normal appearance. She is obese. She is not ill-appearing, toxic-appearing or diaphoretic.  Cardiovascular:     Rate and Rhythm: Normal rate and regular rhythm.     Heart sounds: Murmur heard.  No friction rub. No gallop.   Pulmonary:     Effort: Pulmonary effort is normal. No respiratory distress.     Breath sounds: Normal breath sounds. No stridor. No wheezing, rhonchi or rales.  Chest:     Chest  wall: No tenderness.  Neurological:     Mental Status: She is alert.           Assessment & Plan:  Osteoporosis, unspecified osteoporosis type, unspecified pathological fracture presence  We will schedule the patient for Prolia q 6 months.  I reviewed the mammogram with the patient.  There was no evidence of malignancy.

## 2020-08-05 ENCOUNTER — Telehealth: Payer: Self-pay | Admitting: *Deleted

## 2020-08-05 DIAGNOSIS — Z1211 Encounter for screening for malignant neoplasm of colon: Secondary | ICD-10-CM

## 2020-08-05 NOTE — Telephone Encounter (Signed)
Received call from patient.   Requested referral to Dr. Collene Mares for screening colonoscopy.  Referral orders placed.

## 2020-09-03 ENCOUNTER — Other Ambulatory Visit: Payer: Self-pay | Admitting: *Deleted

## 2020-09-03 MED ORDER — NAPROXEN 375 MG PO TABS: 375.0000 mg | ORAL_TABLET | Freq: Two times a day (BID) | ORAL | 0 refills | Status: DC

## 2020-09-03 NOTE — Telephone Encounter (Signed)
Received call from patient.   Requested refill on Naprosyn.   Ok to refill?

## 2020-10-16 ENCOUNTER — Other Ambulatory Visit: Payer: Self-pay | Admitting: Family Medicine

## 2020-10-16 DIAGNOSIS — M810 Age-related osteoporosis without current pathological fracture: Secondary | ICD-10-CM

## 2020-12-04 ENCOUNTER — Encounter: Payer: Self-pay | Admitting: Family Medicine

## 2020-12-04 ENCOUNTER — Other Ambulatory Visit: Payer: Self-pay

## 2020-12-04 ENCOUNTER — Ambulatory Visit (INDEPENDENT_AMBULATORY_CARE_PROVIDER_SITE_OTHER): Payer: Medicare Other | Admitting: Family Medicine

## 2020-12-04 VITALS — BP 126/80 | HR 66 | Temp 98.7°F | Resp 14 | Ht 64.0 in | Wt 240.0 lb

## 2020-12-04 DIAGNOSIS — G629 Polyneuropathy, unspecified: Secondary | ICD-10-CM

## 2020-12-04 DIAGNOSIS — R2681 Unsteadiness on feet: Secondary | ICD-10-CM | POA: Diagnosis not present

## 2020-12-04 DIAGNOSIS — R5382 Chronic fatigue, unspecified: Secondary | ICD-10-CM | POA: Diagnosis not present

## 2020-12-04 DIAGNOSIS — L608 Other nail disorders: Secondary | ICD-10-CM | POA: Diagnosis not present

## 2020-12-04 MED ORDER — NAPROXEN 375 MG PO TABS: 375.0000 mg | ORAL_TABLET | Freq: Two times a day (BID) | ORAL | 3 refills | Status: DC

## 2020-12-04 NOTE — Progress Notes (Signed)
Subjective:    Patient ID: Christina Phillips, female    DOB: [birth year], 70 y.o.   MRN: [id number]  HPI Patient requests a referral to podiatry.  She has acquired dysmorphic toenails on both feet.  The great toe bilaterally is severely ingrown.  This causes pain and discomfort.  It is ingrown along the medial and lateral nail margins.  She also has thick yellow dysmorphic toenails on both feet as a function of onychomycosis.  She would like to see podiatry to have the ingrown toenails removed permanently.  However she also complains of neuropathy in both feet.  She reports that her toes feel numb.  She also reports that she feels like her balance is affected because she cannot "feel the ground as well".  On examination today she has diminished sensation to 10 g monofilament in the first second and third toes on both feet.  She has diminished sensation to pain and vibration as well.  She states that her toes will occasionally staying with pins-and-needles.  She also endorses some fatigue Past Medical History:  Diagnosis Date   Cancer ([place])    breast right, mastectomy after recurrence from lumpectomy/radiation   Hyperlipidemia    Osteoporosis  PSH- r mastectomy TAH  Family History  Problem Relation Age of Onset   Cancer Father    Hypertension Mother    Cancer Brother    Heart attack Sister     Social History   Socioeconomic History   Marital status: Divorced    Spouse name: Not on file   Number of children: 2   Years of education: Not on file   Highest education level: Not on file  Occupational History   Not on file  Tobacco Use   Smoking status: Former   Smokeless tobacco: Never  Vaping Use   Vaping Use: Never used  Substance and Sexual Activity   Alcohol use: Yes    Comment: occas   Drug use: Never   Sexual activity: Not Currently    Birth control/protection: Surgical  Other Topics Concern   Not on file  Social History Narrative   Not on file   Social  Determinants of Health   Financial Resource Strain: Medium Risk   Difficulty of Paying Living Expenses: Somewhat hard  Food Insecurity: No Food Insecurity   Worried About Charity fundraiser in the Last Year: Never true   [place] in the Last Year: Never true  Transportation Needs: No Transportation Needs   Lack of Transportation (Medical): No   Lack of Transportation (Non-Medical): No  Physical Activity: Sufficiently Active   Days of Exercise per Week: 7 days   Minutes of Exercise per Session: 40 min  Stress: No Stress Concern Present   Feeling of Stress : Only a little  Social Connections: Socially Isolated   Frequency of Communication with Friends and Family: Once a week   Frequency of Social Gatherings with Friends and Family: Once a week   Attends Religious Services: 1 to 4 times per year   Active Member of [employer] or Organizations: No   Attends Music therapist: Never   Marital Status: Divorced  Human resources officer Violence: Not At Risk   Fear of Current or Ex-Partner: No   Emotionally Abused: No   Physically Abused: No   Sexually Abused: No      Review of Systems     Objective:   Physical Exam Constitutional:      Appearance: Normal appearance.  She is normal weight.  Cardiovascular:     Rate and Rhythm: Normal rate and regular rhythm.     Pulses: Normal pulses.          Dorsalis pedis pulses are 2+ on the right side and 2+ on the left side.       Posterior tibial pulses are 2+ on the right side and 2+ on the left side.     Heart sounds: Normal heart sounds.  Pulmonary:     Effort: Pulmonary effort is normal.     Breath sounds: Normal breath sounds.  Musculoskeletal:        General: Deformity present. No swelling.     Right lower leg: No edema.     Left lower leg: No edema.  Feet:     Right foot:     Skin integrity: Skin integrity normal.     Toenail Condition: Right toenails are abnormally thick and ingrown. Fungal disease present.    Left  foot:     Skin integrity: Skin integrity normal.     Toenail Condition: Left toenails are abnormally thick and ingrown. Fungal disease present. Neurological:     Mental Status: She is alert.          Assessment & Plan:  Neuropathy - Plan: Vitamin B12, TSH, COMPLETE METABOLIC PANEL WITH GFR, CBC with Differential/Platelet  Chronic fatigue - Plan: Vitamin B12, TSH, COMPLETE METABOLIC PANEL WITH GFR, CBC with Differential/Platelet  Unsteadiness on feet  - Plan: Vitamin B12  Acquired dysmorphic toenail - Plan: Ambulatory referral to Podiatry Patient has bilateral ingrown great toenails with dysmorphic toenails.  Consult podiatry for removal per request of the patient.  Given her fatigue, her unsteadiness on her feet, and then a peripheral neuropathy, I will check a vitamin B12 level, TSH, CMP, and a CBC.  However I suspect the patient most likely has idiopathic peripheral neuropathy.  At the present time she does not have significant pain to justify Neurontin.  I explained to her the medications do not tend to help with the numbness but I did recommend daily foot exams to rule out ulcer formation and infections.

## 2020-12-05 ENCOUNTER — Ambulatory Visit: Payer: Medicare Other | Admitting: Family Medicine

## 2020-12-05 LAB — COMPLETE METABOLIC PANEL WITH GFR
AG Ratio: 1.9 (calc) (ref 1.0–2.5)
ALT: 14 U/L (ref 6–29)
AST: 16 U/L (ref 10–35)
Albumin: 4.4 g/dL (ref 3.6–5.1)
Alkaline phosphatase (APISO): 59 U/L (ref 37–153)
BUN: 16 mg/dL (ref 7–25)
CO2: 27 mmol/L (ref 20–32)
Calcium: 9.7 mg/dL (ref 8.6–10.4)
Chloride: 105 mmol/L (ref 98–110)
Creat: 0.82 mg/dL (ref 0.50–1.05)
Globulin: 2.3 g/dL (calc) (ref 1.9–3.7)
Glucose, Bld: 73 mg/dL (ref 65–99)
Potassium: 4.3 mmol/L (ref 3.5–5.3)
Sodium: 141 mmol/L (ref 135–146)
Total Bilirubin: 0.4 mg/dL (ref 0.2–1.2)
Total Protein: 6.7 g/dL (ref 6.1–8.1)
eGFR: 78 mL/min/{1.73_m2} (ref >=60)

## 2020-12-05 LAB — TSH: TSH: 1.31 mIU/L (ref 0.40–4.50)

## 2020-12-05 LAB — VITAMIN B12: Vitamin B-12: 385 pg/mL (ref 200–1100)

## 2020-12-05 LAB — CBC WITH DIFFERENTIAL/PLATELET
Absolute Monocytes: 608 cells/uL (ref 200–950)
Basophils Absolute: 53 cells/uL (ref 0–200)
Basophils Relative: 0.7 %
Eosinophils Absolute: 190 cells/uL (ref 15–500)
Eosinophils Relative: 2.5 %
HCT: 44.9 % (ref 35.0–45.0)
Hemoglobin: 14.5 g/dL (ref 11.7–15.5)
Lymphs Abs: 1657 cells/uL (ref 850–3900)
MCH: 29.8 pg (ref 27.0–33.0)
MCHC: 32.3 g/dL (ref 32.0–36.0)
MCV: 92.2 fL (ref 80.0–100.0)
MPV: 10.3 fL (ref 7.5–12.5)
Monocytes Relative: 8 %
Neutro Abs: 5092 cells/uL (ref 1500–7800)
Neutrophils Relative %: 67 %
Platelets: 246 10*3/uL (ref 140–400)
RBC: 4.87 10*6/uL (ref 3.80–5.10)
RDW: 12.5 % (ref 11.0–15.0)
Total Lymphocyte: 21.8 %
WBC: 7.6 10*3/uL (ref 3.8–10.8)

## 2020-12-11 ENCOUNTER — Ambulatory Visit (INDEPENDENT_AMBULATORY_CARE_PROVIDER_SITE_OTHER): Payer: Medicare Other | Admitting: Podiatry

## 2020-12-11 DIAGNOSIS — M79674 Pain in right toe(s): Secondary | ICD-10-CM | POA: Diagnosis not present

## 2020-12-11 DIAGNOSIS — M79675 Pain in left toe(s): Secondary | ICD-10-CM | POA: Diagnosis not present

## 2020-12-11 DIAGNOSIS — L608 Other nail disorders: Secondary | ICD-10-CM | POA: Diagnosis not present

## 2020-12-11 DIAGNOSIS — B351 Tinea unguium: Secondary | ICD-10-CM | POA: Diagnosis not present

## 2020-12-14 NOTE — Progress Notes (Signed)
  Subjective:  Patient ID: Christina Phillips, female    DOB: 10-02-1950,  MRN: [id number]  Chief Complaint  Patient presents with   Nail Problem      (np) Acquired dysmorphic toenail;bil    70 y.o. female presents with the above complaint. History confirmed with patient.  Nails on both feet are thickened elongated and painful and difficult to cut.  The big toes are the worst.  Objective:  Physical Exam: warm, good capillary refill, no trophic changes or ulcerative lesions, normal DP and PT pulses, and normal sensory exam.  Pincer nail deformity bilateral hallux Left Foot: dystrophic yellowed discolored nail plates with subungual debris Right Foot: dystrophic yellowed discolored nail plates with subungual debris   Assessment:   1. Pincer nail deformity      Plan:  Patient was evaluated and treated and all questions answered.  Discussed the etiology and treatment options for the condition in detail with the patient. Educated patient on the topical and oral treatment options for mycotic and pincer nails. Recommended debridement of the nails today. Sharp and mechanical debridement performed of all painful and mycotic nails today. Nails debrided in length and thickness using a nail nipper to level of comfort. Discussed treatment options including appropriate shoe gear. Follow up as needed for painful nails.    Return in about 3 months (around 03/13/2021) for RFC.

## 2021-03-10 ENCOUNTER — Other Ambulatory Visit: Payer: Self-pay

## 2021-03-10 ENCOUNTER — Ambulatory Visit (INDEPENDENT_AMBULATORY_CARE_PROVIDER_SITE_OTHER): Payer: Medicare HMO | Admitting: Family Medicine

## 2021-03-10 ENCOUNTER — Encounter: Payer: Self-pay | Admitting: Family Medicine

## 2021-03-10 VITALS — BP 126/72 | HR 80 | Temp 98.6°F | Resp 18 | Ht 64.0 in | Wt 242.0 lb

## 2021-03-10 DIAGNOSIS — R8290 Unspecified abnormal findings in urine: Secondary | ICD-10-CM | POA: Diagnosis not present

## 2021-03-10 DIAGNOSIS — R300 Dysuria: Secondary | ICD-10-CM

## 2021-03-10 DIAGNOSIS — R2681 Unsteadiness on feet: Secondary | ICD-10-CM

## 2021-03-10 DIAGNOSIS — G629 Polyneuropathy, unspecified: Secondary | ICD-10-CM | POA: Diagnosis not present

## 2021-03-10 LAB — URINALYSIS, ROUTINE W REFLEX MICROSCOPIC
Bilirubin Urine: NEGATIVE
Glucose, UA: NEGATIVE
Hgb urine dipstick: NEGATIVE
Hyaline Cast: NONE SEEN /LPF
Ketones, ur: NEGATIVE
Nitrite: POSITIVE — AB
RBC / HPF: NONE SEEN /HPF (ref 0–2)
Specific Gravity, Urine: 1.023 (ref 1.001–1.035)
pH: 8.5 — ABNORMAL HIGH (ref 5.0–8.0)

## 2021-03-10 LAB — MICROSCOPIC MESSAGE

## 2021-03-10 MED ORDER — CLONAZEPAM 0.5 MG PO TABS: 0.5000 mg | ORAL_TABLET | Freq: Two times a day (BID) | ORAL | 1 refills | Status: DC | PRN

## 2021-03-10 MED ORDER — SULFAMETHOXAZOLE-TRIMETHOPRIM 800-160 MG PO TABS: 1.0000 | ORAL_TABLET | Freq: Two times a day (BID) | ORAL | 0 refills | Status: DC

## 2021-03-10 NOTE — Progress Notes (Signed)
Subjective:    Patient ID: Christina Phillips, female    DOB: [birth year], 70 y.o.   MRN: [id number]  HPI Patient states that she feels "jittery.  She has noticed this over the last few weeks.  She states that she just suddenly feels jittery for no reason.  She denies any tachycardia.  She denies any palpitations.  She denies any syncope or near syncope.  She denies any vertigo.  However she does state that she feels lightheaded occasionally.  She denies any chest pain or shortness of breath or dyspnea on exertion.  She does feel off balance occasionally.  She does have peripheral neuropathy.  However the off-balance sensation waxes and wanes.  When she is inside her home, she is able to walk without any assistance.  However when she goes outside or is in public she feels like she needs to use a walker because she feels more off balance and an open area or in public.  She denies any headaches or blurry vision or hearing loss or tinnitus.  She denies any melena or hematochezia or vaginal bleeding or blood loss.  Lab work in July showed a normal TSH, normal CMP, and normal CBC.  She has been under more financial stress recently Past Medical History:  Diagnosis Date   Cancer [facility])    breast right, mastectomy after recurrence from lumpectomy/radiation   Hyperlipidemia    Osteoporosis  PSH- r mastectomy TAH  Family History  Problem Relation Age of Onset   Cancer Father    Hypertension Mother    Cancer Brother    Heart attack Sister     Social History   Socioeconomic History   Marital status: Divorced    Spouse name: Not on file   Number of children: 2   Years of education: Not on file   Highest education level: Not on file  Occupational History   Not on file  Tobacco Use   Smoking status: Former   Smokeless tobacco: Never  Vaping Use   Vaping Use: Never used  Substance and Sexual Activity   Alcohol use: Yes    Comment: occas   Drug use: Never   Sexual activity: Not  Currently    Birth control/protection: Surgical  Other Topics Concern   Not on file  Social History Narrative   Not on file   Social Determinants of Health   Financial Resource Strain: Medium Risk   Difficulty of Paying Living Expenses: Somewhat hard  Food Insecurity: No Food Insecurity   Worried About Charity fundraiser in the Last Year: Never true   [place] in the Last Year: Never true  Transportation Needs: No Transportation Needs   Lack of Transportation (Medical): No   Lack of Transportation (Non-Medical): No  Physical Activity: Sufficiently Active   Days of Exercise per Week: 7 days   Minutes of Exercise per Session: 40 min  Stress: No Stress Concern Present   Feeling of Stress : Only a little  Social Connections: Socially Isolated   Frequency of Communication with Friends and Family: Once a week   Frequency of Social Gatherings with Friends and Family: Once a week   Attends Religious Services: 1 to 4 times per year   Active Member of [employer] or Organizations: No   Attends Archivist Meetings: Never   Marital Status: Divorced  Human resources officer Violence: Not At Risk   Fear of Current or Ex-Partner: No   Emotionally Abused: No  Physically Abused: No   Sexually Abused: No      Review of Systems     Objective:   Physical Exam Constitutional:      Appearance: Normal appearance. She is normal weight.  Cardiovascular:     Rate and Rhythm: Normal rate and regular rhythm.     Pulses: Normal pulses.     Heart sounds: Normal heart sounds.  Pulmonary:     Effort: Pulmonary effort is normal.     Breath sounds: Normal breath sounds.  Musculoskeletal:        General: No swelling.     Right lower leg: No edema.     Left lower leg: No edema.  Neurological:     Mental Status: She is alert.          Assessment & Plan:  Malodorous urine - Plan: Urinalysis, Routine w reflex microscopic, Urine Culture  Neuropathy  Unsteadiness on feet    Dysuria I am not sure however I feel that the jitteriness and unsteadiness could potentially be anxiety rather than a physical pathologic process.  I discussed this at length with the patient and she is willing to try Klonopin 0.5 mg twice daily for the next for 5 days to see if the symptoms improve on Klonopin.  If so, we could consider switching to an SSRI to help better manage this.  She does have some dysuria and her urinalysis shows nitrates as well as leukocyte esterase.  Therefore I will start her on Bactrim double strength tablets twice daily for 5 days.  Recheck Monday.  If symptoms or not improving, consider getting a cardiac monitor to rule out cardiac arrhythmias repeating a TSH and consulting neurology for the neuropathy

## 2021-03-13 LAB — URINE CULTURE
MICRO NUMBER:: 12548010
SPECIMEN QUALITY:: ADEQUATE

## 2021-03-16 ENCOUNTER — Encounter: Payer: Self-pay | Admitting: Family Medicine

## 2021-03-17 ENCOUNTER — Other Ambulatory Visit: Payer: Self-pay | Admitting: Family Medicine

## 2021-03-17 ENCOUNTER — Other Ambulatory Visit: Payer: Self-pay

## 2021-03-17 ENCOUNTER — Ambulatory Visit: Payer: Medicare HMO | Admitting: Podiatry

## 2021-03-17 DIAGNOSIS — B351 Tinea unguium: Secondary | ICD-10-CM | POA: Diagnosis not present

## 2021-03-17 DIAGNOSIS — L608 Other nail disorders: Secondary | ICD-10-CM

## 2021-03-17 DIAGNOSIS — M79674 Pain in right toe(s): Secondary | ICD-10-CM

## 2021-03-17 DIAGNOSIS — M79675 Pain in left toe(s): Secondary | ICD-10-CM | POA: Diagnosis not present

## 2021-03-17 MED ORDER — ESCITALOPRAM OXALATE 10 MG PO TABS: 10.0000 mg | ORAL_TABLET | Freq: Every day | ORAL | 3 refills | Status: DC

## 2021-03-20 ENCOUNTER — Telehealth: Payer: Self-pay | Admitting: *Deleted

## 2021-03-20 NOTE — Telephone Encounter (Signed)
Received call from patient.   Reports that she is having increased HR, jitteriness, and increased anxiety while on Lexapro. States that she has stopped taking Lexapro and would like to try a different medication.   Please advise.

## 2021-03-21 ENCOUNTER — Encounter: Payer: Self-pay | Admitting: Podiatry

## 2021-03-21 NOTE — Progress Notes (Signed)
  Subjective:  Patient ID: Christina Phillips, female    DOB: 01/16/1951,  MRN: [id number]  Christina Phillips presents to clinic today for painful thick toenails that are difficult to trim. Pain interferes with ambulation. Aggravating factors include wearing enclosed shoe gear. Pain is relieved with periodic professional debridement.  Patient notes no new pedal problems on today's visit.  PCP is Susy Frizzle, MD , and last visit was 03/10/2021.  No Known Allergies  Review of Systems: Negative except as noted in the HPI. Objective:   Constitutional Christina Phillips is a pleasant 70 y.o. Caucasian female, obese in NAD. AAO x 3.   Vascular CFT immediate b/l LE. Palpable DP/PT pulses b/l LE. Digital hair sparse b/l. Skin temperature gradient WNL b/l. No pain with calf compression b/l. No edema noted b/l. Lower extremity skin temperature gradient within normal limits. No cyanosis or clubbing noted.  Neurologic Normal speech. Oriented to person, place, and time. Protective sensation intact 5/5 intact bilaterally with 10g monofilament b/l. Vibratory sensation intact b/l.  Dermatologic Pedal skin warm and supple b/l.  No open wounds b/l. No interdigital macerations. Toenails 2-5 b/l elongated, thickened, discolored with subungual debris. +Tenderness with dorsal palpation of nailplates. Anonychia noted b/l great toes; nailbeds epithelialized. No hyperkeratotic nor porokeratotic lesions noted b/l. No open wounds b/l LE. No interdigital macerations noted b/l LE. Pincer nail deformity bilateral great toes. No erythema, no edema, no drainage, no fluctuance. Nail border hypertrophy present.  Sign(s) of infection: no clinical signs of infection noted on examination today..  Orthopedic: Normal muscle strength 5/5 to all lower extremity muscle groups bilaterally. No pain crepitus or joint limitation noted with ROM b/l lower extremities. No gross bony deformities b/l lower extremities.    Radiographs: None Assessment:   1. Pain due to onychomycosis of toenails of both feet   2. Pincer nail deformity    Plan:  Patient was evaluated and treated and all questions answered. Consent given for treatment as described below: -Examined patient. -Toenails 1-5 b/l were debrided in length and girth with sterile nail nippers and dremel without iatrogenic bleeding.  -Offending nail border debrided and curretaged bilateral great toes utilizing sterile nail nipper and currette. Border cleansed with alcohol and triple antibiotic applied. No further treatment required by patient/caregiver. -Patient/POA to call should there be question/concern in the interim.  Return in about 3 months (around 06/17/2021).  Christina Phillips, DPM

## 2021-03-23 MED ORDER — FLUOXETINE HCL 20 MG PO CAPS: 20.0000 mg | ORAL_CAPSULE | Freq: Every day | ORAL | 3 refills | Status: DC

## 2021-03-23 NOTE — Addendum Note (Signed)
Addended by: Sheral Flow on: 03/23/2021 05:52 PM   Modules accepted: Orders

## 2021-03-23 NOTE — Telephone Encounter (Signed)
Call placed to patient and patient made aware.   Agreeable to plan.   Prescription sent to pharmacy.

## 2021-04-02 ENCOUNTER — Other Ambulatory Visit: Payer: Medicare Other

## 2021-04-13 ENCOUNTER — Other Ambulatory Visit: Payer: Self-pay

## 2021-04-13 DIAGNOSIS — M1711 Unilateral primary osteoarthritis, right knee: Secondary | ICD-10-CM

## 2021-04-13 MED ORDER — PRAVASTATIN SODIUM 10 MG PO TABS: 10.0000 mg | ORAL_TABLET | Freq: Every day | ORAL | 2 refills | Status: DC

## 2021-04-14 ENCOUNTER — Other Ambulatory Visit: Payer: Self-pay | Admitting: *Deleted

## 2021-04-14 DIAGNOSIS — M1711 Unilateral primary osteoarthritis, right knee: Secondary | ICD-10-CM

## 2021-04-14 MED ORDER — PRAVASTATIN SODIUM 10 MG PO TABS: 10.0000 mg | ORAL_TABLET | Freq: Every day | ORAL | 2 refills | Status: DC

## 2021-04-27 ENCOUNTER — Encounter: Payer: Self-pay | Admitting: Family Medicine

## 2021-05-15 ENCOUNTER — Ambulatory Visit (INDEPENDENT_AMBULATORY_CARE_PROVIDER_SITE_OTHER): Payer: Medicare HMO

## 2021-05-15 ENCOUNTER — Other Ambulatory Visit: Payer: Self-pay

## 2021-05-15 VITALS — Ht 64.0 in | Wt 242.0 lb

## 2021-05-15 DIAGNOSIS — Z0001 Encounter for general adult medical examination with abnormal findings: Secondary | ICD-10-CM | POA: Diagnosis not present

## 2021-05-15 DIAGNOSIS — H539 Unspecified visual disturbance: Secondary | ICD-10-CM | POA: Diagnosis not present

## 2021-05-15 DIAGNOSIS — Z0000 Encounter for general adult medical examination without abnormal findings: Secondary | ICD-10-CM

## 2021-05-15 NOTE — Patient Instructions (Signed)
Ms. Coulston , Thank you for taking time to come for your Medicare Wellness Visit. I appreciate your ongoing commitment to your health goals. Please review the following plan we discussed and let me know if I can assist you in the future.   Screening recommendations/referrals: Colonoscopy: Done 09/16/2020 Repeat in 10 years  Mammogram: Done 07/28/2020 Repeat annually  Bone Density: Done 10/18/2017. Scheduled for 06/2020.  Recommended yearly ophthalmology/optometry visit for glaucoma screening and checkup Recommended yearly dental visit for hygiene and checkup  Vaccinations: Influenza vaccine: Done 01/24/2021 Repeat annually  Pneumococcal vaccine: Due or bring copy of vaccine record, so they can be entered into your chart. Thank you! Tdap vaccine: Due Repeat in 10 years  Shingles vaccine: Shingrix discussed. Please contact your pharmacy for coverage information.     Covid-19:Done 08/21/2019, 09/18/2019 and 02/17/2021.  Advanced directives: Advance directive discussed with you today. Even though you declined this today, please call our office should you change your mind, and we can give you the proper paperwork for you to fill out.   Conditions/risks identified: Aim for 30 minutes of exercise or brisk walking each day, drink 6-8 glasses of water and eat lots of fruits and vegetables.   Next appointment: Follow up in one year for your annual wellness visit 05/21/2022 @ 9:00 am.    Preventive Care 65 Years and Older, Female Preventive care refers to lifestyle choices and visits with your health care provider that can promote health and wellness. What does preventive care include? A yearly physical exam. This is also called an annual well check. Dental exams once or twice a year. Routine eye exams. Ask your health care provider how often you should have your eyes checked. Personal lifestyle choices, including: Daily care of your teeth and gums. Regular physical activity. Eating a healthy  diet. Avoiding tobacco and drug use. Limiting alcohol use. Practicing safe sex. Taking low-dose aspirin every day. Taking vitamin and mineral supplements as recommended by your health care provider. What happens during an annual well check? The services and screenings done by your health care provider during your annual well check will depend on your age, overall health, lifestyle risk factors, and family history of disease. Counseling  Your health care provider may ask you questions about your: Alcohol use. Tobacco use. Drug use. Emotional well-being. Home and relationship well-being. Sexual activity. Eating habits. History of falls. Memory and ability to understand (cognition). Work and work Statistician. Reproductive health. Screening  You may have the following tests or measurements: Height, weight, and BMI. Blood pressure. Lipid and cholesterol levels. These may be checked every 5 years, or more frequently if you are over 54 years old. Skin check. Lung cancer screening. You may have this screening every year starting at age 79 if you have a 30-pack-year history of smoking and currently smoke or have quit within the past 15 years. Fecal occult blood test (FOBT) of the stool. You may have this test every year starting at age 55. Flexible sigmoidoscopy or colonoscopy. You may have a sigmoidoscopy every 5 years or a colonoscopy every 10 years starting at age 36. Hepatitis C blood test. Hepatitis B blood test. Sexually transmitted disease (STD) testing. Diabetes screening. This is done by checking your blood sugar (glucose) after you have not eaten for a while (fasting). You may have this done every 1-3 years. Bone density scan. This is done to screen for osteoporosis. You may have this done starting at age 5. Mammogram. This may be done every 1-2  years. Talk to your health care provider about how often you should have regular mammograms. Talk with your health care provider about  your test results, treatment options, and if necessary, the need for more tests. Vaccines  Your health care provider may recommend certain vaccines, such as: Influenza vaccine. This is recommended every year. Tetanus, diphtheria, and acellular pertussis (Tdap, Td) vaccine. You may need a Td booster every 10 years. Zoster vaccine. You may need this after age 61. Pneumococcal 13-valent conjugate (PCV13) vaccine. One dose is recommended after age 41. Pneumococcal polysaccharide (PPSV23) vaccine. One dose is recommended after age 90. Talk to your health care provider about which screenings and vaccines you need and how often you need them. This information is not intended to replace advice given to you by your health care provider. Make sure you discuss any questions you have with your health care provider. Document Released: 05/30/2015 Document Revised: 01/21/2016 Document Reviewed: 03/04/2015 Elsevier Interactive Patient Education  2017 [place] Prevention in the Home Falls can cause injuries. They can happen to people of all ages. There are many things you can do to make your home safe and to help prevent falls. What can I do on the outside of my home? Regularly fix the edges of walkways and driveways and fix any cracks. Remove anything that might make you trip as you walk through a door, such as a raised step or threshold. Trim any bushes or trees on the path to your home. Use bright outdoor lighting. Clear any walking paths of anything that might make someone trip, such as rocks or tools. Regularly check to see if handrails are loose or broken. Make sure that both sides of any steps have handrails. Any raised decks and porches should have guardrails on the edges. Have any leaves, snow, or ice cleared regularly. Use sand or salt on walking paths during winter. Clean up any spills in your garage right away. This includes oil or grease spills. What can I do in the bathroom? Use  night lights. Install grab bars by the toilet and in the tub and shower. Do not use towel bars as grab bars. Use non-skid mats or decals in the tub or shower. If you need to sit down in the shower, use a plastic, non-slip stool. Keep the floor dry. Clean up any water that spills on the floor as soon as it happens. Remove soap buildup in the tub or shower regularly. Attach bath mats securely with double-sided non-slip rug tape. Do not have throw rugs and other things on the floor that can make you trip. What can I do in the bedroom? Use night lights. Make sure that you have a light by your bed that is easy to reach. Do not use any sheets or blankets that are too big for your bed. They should not hang down onto the floor. Have a firm chair that has side arms. You can use this for support while you get dressed. Do not have throw rugs and other things on the floor that can make you trip. What can I do in the kitchen? Clean up any spills right away. Avoid walking on wet floors. Keep items that you use a lot in easy-to-reach places. If you need to reach something above you, use a strong step stool that has a grab bar. Keep electrical cords out of the way. Do not use floor polish or wax that makes floors slippery. If you must use wax, use non-skid floor  wax. Do not have throw rugs and other things on the floor that can make you trip. What can I do with my stairs? Do not leave any items on the stairs. Make sure that there are handrails on both sides of the stairs and use them. Fix handrails that are broken or loose. Make sure that handrails are as long as the stairways. Check any carpeting to make sure that it is firmly attached to the stairs. Fix any carpet that is loose or worn. Avoid having throw rugs at the top or bottom of the stairs. If you do have throw rugs, attach them to the floor with carpet tape. Make sure that you have a light switch at the top of the stairs and the bottom of the  stairs. If you do not have them, ask someone to add them for you. What else can I do to help prevent falls? Wear shoes that: Do not have high heels. Have rubber bottoms. Are comfortable and fit you well. Are closed at the toe. Do not wear sandals. If you use a stepladder: Make sure that it is fully opened. Do not climb a closed stepladder. Make sure that both sides of the stepladder are locked into place. Ask someone to hold it for you, if possible. Clearly mark and make sure that you can see: Any grab bars or handrails. First and last steps. Where the edge of each step is. Use tools that help you move around (mobility aids) if they are needed. These include: Canes. Walkers. Scooters. Crutches. Turn on the lights when you go into a dark area. Replace any light bulbs as soon as they burn out. Set up your furniture so you have a clear path. Avoid moving your furniture around. If any of your floors are uneven, fix them. If there are any pets around you, be aware of where they are. Review your medicines with your doctor. Some medicines can make you feel dizzy. This can increase your chance of falling. Ask your doctor what other things that you can do to help prevent falls. This information is not intended to replace advice given to you by your health care provider. Make sure you discuss any questions you have with your health care provider. Document Released: 02/27/2009 Document Revised: 10/09/2015 Document Reviewed: 06/07/2014 Elsevier Interactive Patient Education  2017 [employer].

## 2021-05-15 NOTE — Progress Notes (Signed)
Subjective:   Christina Phillips is a 70 y.o. female who presents for Medicare Annual (Subsequent) preventive examination. Virtual Visit via Telephone Note  I connected with  Christina Phillips on 05/15/21 at  9:00 AM EST by telephone and verified that I am speaking with the correct person using two identifiers.  Location: Patient: HOME Provider: [facility] Persons participating in the virtual visit: patient/Nurse Health Advisor   I discussed the limitations, risks, security and privacy concerns of performing an evaluation and management service by telephone and the availability of in person appointments. The patient expressed understanding and agreed to proceed.  Interactive audio and video telecommunications were attempted between this nurse and patient, however failed, due to patient having technical difficulties OR patient did not have access to video capability.  We continued and completed visit with audio only.  Some vital signs may be absent or patient reported.   Christina Driver, LPN  Review of Systems     Cardiac Risk Factors include: advanced age (>64men, >31 women);dyslipidemia;obesity (BMI >30kg/m2)  PHONE VISIT. PT AT HOME. NURSE AT [facility].    Objective:    Today's Vitals   05/15/21 0859  Weight: 242 lb (109.8 kg)  Height: 5\' 4"  (1.626 m)   Body mass index is 41.54 kg/m.  Advanced Directives 05/15/2021  Does Patient Have a Medical Advance Directive? No  Would patient like information on creating a medical advance directive? No - Patient declined    Current Medications (verified) Outpatient Encounter Medications as of 05/15/2021  Medication Sig   clonazePAM (KLONOPIN) 0.5 MG tablet Take 1 tablet (0.5 mg total) by mouth 2 (two) times daily as needed for anxiety.   FLUoxetine (PROZAC) 20 MG capsule Take 1 capsule (20 mg total) by mouth daily.   MODERNA COVID-19 BIVAL BOOSTER 50 MCG/0.5ML injection    Multiple Vitamins-Minerals (CENTRUM ADULTS PO) Take  by mouth.   naproxen (NAPROSYN) 375 MG tablet Take 1 tablet (375 mg total) by mouth 2 (two) times daily.   pravastatin (PRAVACHOL) 10 MG tablet Take 1 tablet (10 mg total) by mouth daily.   sulfamethoxazole-trimethoprim (BACTRIM DS) 800-160 MG tablet Take 1 tablet by mouth 2 (two) times daily.   No facility-administered encounter medications on file as of 05/15/2021.    Allergies (verified) Patient has no known allergies.   History: Past Medical History:  Diagnosis Date   Cancer ([place])    breast right, mastectomy after recurrence from lumpectomy/radiation   Hyperlipidemia    Past Surgical History:  Procedure Laterality Date   ABDOMINAL HYSTERECTOMY N/A    suspicious lesion   BREAST SURGERY N/A    Phreesia 02/29/2020   Family History  Problem Relation Age of Onset   Cancer Father    Hypertension Mother    Cancer Brother    Heart attack Sister    Social History   Socioeconomic History   Marital status: Divorced    Spouse name: Not on file   Number of children: 2   Years of education: Not on file   Highest education level: Not on file  Occupational History   Not on file  Tobacco Use   Smoking status: Former   Smokeless tobacco: Never  Vaping Use   Vaping Use: Never used  Substance and Sexual Activity   Alcohol use: Yes    Comment: occas   Drug use: Never   Sexual activity: Not Currently    Birth control/protection: Surgical  Other Topics Concern   Not on file  Social History Narrative   2 children.   5 grandchildren.   Social Determinants of Health   Financial Resource Strain: Low Risk    Difficulty of Paying Living Expenses: Not hard at all  Food Insecurity: No Food Insecurity   Worried About Charity fundraiser in the Last Year: Never true   [place] in the Last Year: Never true  Transportation Needs: No Transportation Needs   Lack of Transportation (Medical): No   Lack of Transportation (Non-Medical): No  Physical Activity: Sufficiently  Active   Days of Exercise per Week: 5 days   Minutes of Exercise per Session: 60 min  Stress: No Stress Concern Present   Feeling of Stress : Not at all  Social Connections: Moderately Integrated   Frequency of Communication with Friends and Family: More than three times a week   Frequency of Social Gatherings with Friends and Family: More than three times a week   Attends Religious Services: More than 4 times per year   Active Member of [employer] or Organizations: Yes   Attends Music therapist: More than 4 times per year   Marital Status: Divorced    Tobacco Counseling Counseling given: Not Answered   Clinical Intake:  Pre-visit preparation completed: Yes  Pain : No/denies pain     BMI - recorded: 41.54 Nutritional Status: BMI > 30  Obese Nutritional Risks: None Diabetes: No  How often do you need to have someone help you when you read instructions, pamphlets, or other written materials from your doctor or pharmacy?: 1 - Never  Diabetic?nO  Interpreter Needed?: No  Information entered by :: Christina Makyna Niehoff, LPN   Activities of Daily Living In your present state of health, do you have any difficulty performing the following activities: 05/15/2021  Hearing? N  Vision? N  Difficulty concentrating or making decisions? N  Walking or climbing stairs? N  Dressing or bathing? N  Doing errands, shopping? N  Preparing Food and eating ? N  Using the Toilet? N  In the past six months, have you accidently leaked urine? Y  Do you have problems with loss of bowel control? N  Managing your Medications? N  Managing your Finances? N  Housekeeping or managing your Housekeeping? N  Some recent data might be hidden    Patient Care Team: Susy Frizzle, MD as PCP - General (Family Medicine)  Indicate any recent Medical Services you may have received from other than [facility] providers in the past year (date may be approximate).     Assessment:   This is a routine  wellness examination for Christina Phillips.  Hearing/Vision screen Hearing Screening - Comments:: No hearing issues. Vision Screening - Comments:: Glasses. 2021. Would like referral.  Dietary issues and exercise activities discussed: Current Exercise Habits: Home exercise routine, Type of exercise: walking, Time (Minutes): 60, Frequency (Times/Week): 5, Weekly Exercise (Minutes/Week): 300, Intensity: Mild, Exercise limited by: cardiac condition(s);psychological condition(s)   Goals Addressed             This Visit's Progress    Exercise 3x per week (30 min per time)       Continue to exercise and stay healthy       Depression Screen PHQ 2/9 Scores 05/15/2021 04/15/2020 03/03/2020  PHQ - 2 Score 0 0 0  PHQ- 9 Score - 4 -    Fall Risk Fall Risk  05/15/2021 04/15/2020 03/03/2020  Falls in the past year? 0 0 0  Number falls  in past yr: 0 0 0  Injury with Fall? 0 0 0  Risk for fall due to : Impaired balance/gait No Fall Risks -  Follow up Falls prevention discussed - Falls evaluation completed    FALL RISK PREVENTION PERTAINING TO THE HOME:  Any stairs in or around the home? Yes  If so, are there any without handrails? No  Home free of loose throw rugs in walkways, pet beds, electrical cords, etc? Yes  Adequate lighting in your home to reduce risk of falls? Yes   ASSISTIVE DEVICES UTILIZED TO PREVENT FALLS:  Life alert? Yes  Use of a cane, walker or w/c? No  Grab bars in the bathroom? Yes  Shower chair or bench in shower? No  Elevated toilet seat or a handicapped toilet? Yes   TIMED UP AND GO:  Was the test performed? No .  Phone visit.  Cognitive Function:     6CIT Screen 05/15/2021  What Year? 0 points  What month? 0 points  What time? 0 points  Count back from 20 0 points  Months in reverse 2 points  Repeat phrase 0 points  Total Score 2    Immunizations Immunization History  Administered Date(s) Administered   Influenza-Unspecified 01/25/2020,  01/24/2021   Moderna SARS-COV2 Booster Vaccination 02/17/2021   Moderna Sars-Covid-2 Vaccination 08/21/2019, 09/18/2019    TDAP status: Due, Education has been provided regarding the importance of this vaccine. Advised may receive this vaccine at local pharmacy or Health Dept. Aware to provide a copy of the vaccination record if obtained from local pharmacy or Health Dept. Verbalized acceptance and understanding.  Flu Vaccine status: Up to date  Pneumococcal vaccine status: Due, Education has been provided regarding the importance of this vaccine. Advised may receive this vaccine at local pharmacy or Health Dept. Aware to provide a copy of the vaccination record if obtained from local pharmacy or Health Dept. Verbalized acceptance and understanding.  Covid-19 vaccine status: Completed vaccines  Qualifies for Shingles Vaccine? Yes   Zostavax completed No   Shingrix Completed?: No.    Education has been provided regarding the importance of this vaccine. Patient has been advised to call insurance company to determine out of pocket expense if they have not yet received this vaccine. Advised may also receive vaccine at local pharmacy or Health Dept. Verbalized acceptance and understanding.  Screening Tests Health Maintenance  Topic Date Due   Pneumonia Vaccine 16+ Years old (1 - PCV) Never done   Hepatitis C Screening  Never done   TETANUS/TDAP  Never done   Zoster Vaccines- Shingrix (1 of 2) Never done   COVID-19 Vaccine (3 - Booster for Moderna series) 04/14/2021   MAMMOGRAM  07/29/2022   COLONOSCOPY (Pts 45-91yrs Insurance coverage will need to be confirmed)  09/17/2030   INFLUENZA VACCINE  Completed   DEXA SCAN  Completed   HPV VACCINES  Aged Out    Health Maintenance  Health Maintenance Due  Topic Date Due   Pneumonia Vaccine 18+ Years old (1 - PCV) Never done   Hepatitis C Screening  Never done   TETANUS/TDAP  Never done   Zoster Vaccines- Shingrix (1 of 2) Never done    COVID-19 Vaccine (3 - Booster for Moderna series) 04/14/2021    Colorectal cancer screening: Type of screening: Colonoscopy. Completed 10/16/2020. Repeat every 10 years  Mammogram status: Completed 07/28/20. Repeat every year  Bone Density status: Completed 10/18/2017. Results reflect: Bone density results: OSTEOPOROSIS. Repeat every 2 years.  Lung  Cancer Screening: (Low Dose CT Chest recommended if Age 48-80 years, 30 pack-year currently smoking OR have quit w/in 15years.) does not qualify.    Additional Screening:  Hepatitis C Screening: does qualify; Completed Due.  Vision Screening: Recommended annual ophthalmology exams for early detection of glaucoma and other disorders of the eye. Is the patient up to date with their annual eye exam?  No  Who is the provider or what is the name of the office in which the patient attends annual eye exams? Does not have a provider yet. If pt is not established with a provider, would they like to be referred to a provider to establish care? Yes .   Dental Screening: Recommended annual dental exams for proper oral hygiene  Community Resource Referral / Chronic Care Management: CRR required this visit?  No   CCM required this visit?  No      Plan:     I have personally reviewed and noted the following in the patients chart:   Medical and social history Use of alcohol, tobacco or illicit drugs  Current medications and supplements including opioid prescriptions.  Functional ability and status Nutritional status Physical activity Advanced directives List of other physicians Hospitalizations, surgeries, and ER visits in previous 12 months Vitals Screenings to include cognitive, depression, and falls Referrals and appointments  In addition, I have reviewed and discussed with patient certain preventive protocols, quality metrics, and best practice recommendations. A written personalized care plan for preventive services as well as general  preventive health recommendations were provided to patient.     Christina Driver, LPN   [id number]   Nurse Notes: Pt has bone density scheduled for 06/2020. Pt would like referral for Optometrist. Up to date on all other health maintenance. Discussed Shingrix, Prevnar and how to obtain.

## 2021-05-30 ENCOUNTER — Other Ambulatory Visit: Payer: Self-pay | Admitting: Family Medicine

## 2021-05-30 DIAGNOSIS — M1711 Unilateral primary osteoarthritis, right knee: Secondary | ICD-10-CM

## 2021-06-01 ENCOUNTER — Other Ambulatory Visit: Payer: Self-pay

## 2021-06-02 ENCOUNTER — Other Ambulatory Visit: Payer: Self-pay

## 2021-06-02 MED ORDER — NAPROXEN 375 MG PO TABS: 375.0000 mg | ORAL_TABLET | Freq: Two times a day (BID) | ORAL | 3 refills | Status: DC

## 2021-06-18 ENCOUNTER — Other Ambulatory Visit: Payer: Self-pay | Admitting: Family Medicine

## 2021-06-18 DIAGNOSIS — M810 Age-related osteoporosis without current pathological fracture: Secondary | ICD-10-CM

## 2021-06-22 ENCOUNTER — Other Ambulatory Visit: Payer: Self-pay

## 2021-06-29 ENCOUNTER — Ambulatory Visit: Payer: Medicare HMO | Admitting: Podiatry

## 2021-06-29 ENCOUNTER — Other Ambulatory Visit: Payer: Self-pay

## 2021-06-29 DIAGNOSIS — L608 Other nail disorders: Secondary | ICD-10-CM | POA: Diagnosis not present

## 2021-06-29 DIAGNOSIS — M79674 Pain in right toe(s): Secondary | ICD-10-CM

## 2021-06-29 DIAGNOSIS — M79675 Pain in left toe(s): Secondary | ICD-10-CM | POA: Diagnosis not present

## 2021-06-29 DIAGNOSIS — B351 Tinea unguium: Secondary | ICD-10-CM

## 2021-07-05 ENCOUNTER — Encounter: Payer: Self-pay | Admitting: Podiatry

## 2021-07-05 NOTE — Progress Notes (Signed)
Subjective: Christina Phillips is a 71 y.o. female patient seen today for follow up bilateral great toe pincer nail deformity and thick, elongated toenails 2-5 bilaterally which are tender when wearing enclosed shoe gear..   New problem(s)/concern(s) today: None    PCP is Susy Frizzle, MD. Last visit was: 03/16/2021.  No Known Allergies  Objective: Physical Exam  General: Patient is a pleasant 71 y.o. Caucasian female obese in NAD. AAO x 3.   Neurovascular Examination: CFT <3 seconds b/l LE. Palpable pedal pulses b/l LE. Pedal hair sparse. No pain with calf compression b/l. Lower extremity skin temperature gradient within normal limits. No cyanosis or clubbing noted b/l LE.  Protective sensation intact 5/5 intact bilaterally with 10g monofilament b/l. Vibratory sensation intact b/l.  Dermatological:  Pedal integument with normal turgor, texture and tone BLE. No open wounds b/l LE. No interdigital macerations noted b/l LE. Toenails 2-5 bilaterally elongated, discolored, dystrophic, thickened, and crumbly with subungual debris and tenderness to dorsal palpation. Pincer nail deformity bilateral great toes. No erythema, no edema, no drainage, no fluctuance. Nail border hypertrophy minimal.  Sign(s) of infection: no clinical signs of infection noted on examination today.. No hyperkeratotic nor porokeratotic lesions present on today's visit.  Musculoskeletal:  Normal muscle strength 5/5 to all lower extremity muscle groups bilaterally. No pain, crepitus or joint limitation noted with ROM b/l LE. No gross bony pedal deformities b/l. Patient ambulates independently without assistive aids.  Assessment: 1. Pain due to onychomycosis of toenails of both feet   2. Pincer nail deformity   3. Pain in toes of both feet    Plan: Patient was evaluated and treated and all questions answered. Consent given for treatment as described below: -Mycotic toenails 2-5 bilaterally were debrided in  length and girth with sterile nail nippers and dremel without iatrogenic bleeding. -Offending nail border debrided and curretaged bilateral great toes utilizing sterile nail nipper and currette. Border cleansed with alcohol and triple antibiotic applied. No further treatment required by patient/caregiver. -Patient/POA to call should there be question/concern in the interim.  Return in about 3 months (around 09/26/2021).  Marzetta Board, DPM

## 2021-07-15 ENCOUNTER — Other Ambulatory Visit: Payer: Self-pay | Admitting: Family Medicine

## 2021-07-15 DIAGNOSIS — Z1231 Encounter for screening mammogram for malignant neoplasm of breast: Secondary | ICD-10-CM

## 2021-07-29 ENCOUNTER — Ambulatory Visit
Admission: RE | Admit: 2021-07-29 | Discharge: 2021-07-29 | Disposition: A | Discharge status: Home / Self Care | Payer: Medicare HMO | Source: Ambulatory Visit | Attending: Family Medicine | Admitting: Family Medicine

## 2021-07-29 DIAGNOSIS — Z1231 Encounter for screening mammogram for malignant neoplasm of breast: Secondary | ICD-10-CM | POA: Diagnosis not present

## 2021-09-01 DIAGNOSIS — H2513 Age-related nuclear cataract, bilateral: Secondary | ICD-10-CM | POA: Diagnosis not present

## 2021-09-01 DIAGNOSIS — H524 Presbyopia: Secondary | ICD-10-CM | POA: Diagnosis not present

## 2021-09-11 DIAGNOSIS — H2512 Age-related nuclear cataract, left eye: Secondary | ICD-10-CM | POA: Diagnosis not present

## 2021-09-11 DIAGNOSIS — H2513 Age-related nuclear cataract, bilateral: Secondary | ICD-10-CM | POA: Diagnosis not present

## 2021-09-17 ENCOUNTER — Telehealth: Payer: Self-pay

## 2021-09-17 NOTE — Telephone Encounter (Signed)
Pt came in today asking for her handicap placard form to be filled out by pcp. Pt provided envelope for form along with check to be mailed when completed. ? ?Cb#: [phone] ?

## 2021-09-22 NOTE — Telephone Encounter (Signed)
Susy Frizzle, MD: ?I received a request for a handicapped placard.  Why is this necessary?  I do not have any medical diagnosis or documentation that would support a handicapped placard.  ? ? ?

## 2021-09-24 NOTE — Telephone Encounter (Signed)
I have tried calling pt no answer and no vm.  ?

## 2021-09-28 ENCOUNTER — Encounter: Payer: Self-pay | Admitting: Podiatry

## 2021-09-28 ENCOUNTER — Ambulatory Visit: Payer: Medicare HMO | Admitting: Podiatry

## 2021-09-28 DIAGNOSIS — B351 Tinea unguium: Secondary | ICD-10-CM

## 2021-09-28 DIAGNOSIS — M79675 Pain in left toe(s): Secondary | ICD-10-CM | POA: Diagnosis not present

## 2021-09-28 DIAGNOSIS — M79674 Pain in right toe(s): Secondary | ICD-10-CM | POA: Diagnosis not present

## 2021-10-04 NOTE — Progress Notes (Signed)
  Subjective:  Patient ID: Christina Phillips, female    DOB: 1950-06-07,  MRN: [id number]  Mariteres Debbra Riding presents to clinic today for ingrown toenail to the bilateral great toes  New problem(s): None.   PCP is Susy Frizzle, MD , and last visit was March 10, 2021.  No Known Allergies  Review of Systems: Negative except as noted in the HPI.  Objective: No changes noted in today's physical examination.  General: Patient is a pleasant 71 y.o. Caucasian female obese in NAD. AAO x 3.   Neurovascular Examination: CFT <3 seconds b/l LE. Palpable pedal pulses b/l LE. Pedal hair sparse. No pain with calf compression b/l. Lower extremity skin temperature gradient within normal limits. No cyanosis or clubbing noted b/l LE.  Protective sensation intact 5/5 intact bilaterally with 10g monofilament b/l. Vibratory sensation intact b/l.  Dermatological:  Pedal integument with normal turgor, texture and tone BLE. No open wounds b/l LE. No interdigital macerations noted b/l LE. Toenails 2-5 bilaterally elongated, discolored, dystrophic, thickened, and crumbly with subungual debris and tenderness to dorsal palpation. Pincer nail deformity bilateral great toes. No erythema, no edema, no drainage, no fluctuance. Nail border hypertrophy minimal.  Sign(s) of infection: no clinical signs of infection noted on examination today.. No hyperkeratotic nor porokeratotic lesions present on today's visit.  Musculoskeletal:  Normal muscle strength 5/5 to all lower extremity muscle groups bilaterally. No pain, crepitus or joint limitation noted with ROM b/l LE. No gross bony pedal deformities b/l. Patient ambulates independently without assistive aids.  Assessment/Plan: 1. Pain due to onychomycosis of toenails of both feet    -Patient was evaluated and treated. All patient's and/or POA's questions/concerns answered on today's visit. -Patient to continue soft, supportive shoe gear daily. -Mycotic  toenails 1-5 bilaterally were debrided in length and girth with sterile nail nippers and dremel without incident. -Patient/POA to call should there be question/concern in the interim.   Return in about 3 months (around 12/29/2021).  Marzetta Board, DPM

## 2021-10-06 ENCOUNTER — Telehealth: Payer: Self-pay | Admitting: Family Medicine

## 2021-10-06 NOTE — Telephone Encounter (Signed)
Patient's disability parking placard application completed and mailed out to [facility] with enclosed check. Copy at front desk for her if she wants to pick it up. Left message to return call.

## 2021-10-06 NOTE — Telephone Encounter (Signed)
Form completed, send to front office to call pt.

## 2021-11-09 ENCOUNTER — Other Ambulatory Visit: Payer: Self-pay

## 2021-11-12 DIAGNOSIS — H25043 Posterior subcapsular polar age-related cataract, bilateral: Secondary | ICD-10-CM | POA: Diagnosis not present

## 2021-11-12 DIAGNOSIS — H2512 Age-related nuclear cataract, left eye: Secondary | ICD-10-CM | POA: Diagnosis not present

## 2021-11-12 DIAGNOSIS — H25013 Cortical age-related cataract, bilateral: Secondary | ICD-10-CM | POA: Diagnosis not present

## 2021-11-13 DIAGNOSIS — H2511 Age-related nuclear cataract, right eye: Secondary | ICD-10-CM | POA: Diagnosis not present

## 2021-11-24 DIAGNOSIS — H2512 Age-related nuclear cataract, left eye: Secondary | ICD-10-CM | POA: Diagnosis not present

## 2021-12-10 DIAGNOSIS — H2511 Age-related nuclear cataract, right eye: Secondary | ICD-10-CM | POA: Diagnosis not present

## 2021-12-17 ENCOUNTER — Encounter (HOSPITAL_COMMUNITY): Payer: Self-pay | Admitting: *Deleted

## 2021-12-17 ENCOUNTER — Ambulatory Visit (HOSPITAL_COMMUNITY)
Admission: EM | Admit: 2021-12-17 | Discharge: 2021-12-17 | Disposition: A | Discharge status: Home / Self Care | Payer: Medicare HMO

## 2021-12-17 DIAGNOSIS — H2511 Age-related nuclear cataract, right eye: Secondary | ICD-10-CM | POA: Diagnosis not present

## 2021-12-17 DIAGNOSIS — N390 Urinary tract infection, site not specified: Secondary | ICD-10-CM

## 2021-12-17 LAB — POCT URINALYSIS DIPSTICK, ED / UC
Bilirubin Urine: NEGATIVE
Glucose, UA: NEGATIVE mg/dL
Hgb urine dipstick: NEGATIVE
Ketones, ur: NEGATIVE mg/dL
Nitrite: POSITIVE — AB
Protein, ur: NEGATIVE mg/dL
Specific Gravity, Urine: 1.01 (ref 1.005–1.030)
Urobilinogen, UA: 0.2 mg/dL (ref 0.0–1.0)
pH: >=9 (ref 5.0–8.0)

## 2021-12-17 MED ORDER — SULFAMETHOXAZOLE-TRIMETHOPRIM 800-160 MG PO TABS
1.0000 | ORAL_TABLET | Freq: Two times a day (BID) | ORAL | 0 refills | Status: AC
Start: 2021-12-17 — End: 2021-12-24

## 2021-12-17 NOTE — ED Provider Notes (Addendum)
[place]    CSN: [id number] Arrival date & time: 12/17/21  0808      History   Chief Complaint Chief Complaint  Patient presents with   Dysuria    HPI Christina Phillips is a 71 y.o. female.   HPI  She is in today for one night of dysuria. She denies a fever, chills, hematuria, shortness of breath or chest pain. She has had a UTI in the past but no recent anbx use. She denies any other concerns. She has not used any OTC tx  Past Medical History:  Diagnosis Date   Cancer [facility])    breast right, mastectomy after recurrence from lumpectomy/radiation   Hyperlipidemia     There are no problems to display for this patient.   Past Surgical History:  Procedure Laterality Date   ABDOMINAL HYSTERECTOMY N/A    suspicious lesion   BREAST SURGERY N/A    Phreesia 02/29/2020   EYE SURGERY      OB History     Gravida  3   Para  2   Term  2   Preterm      AB  1   Living  2      SAB  1   IAB      Ectopic      Multiple      Live Births  2            Home Medications    Prior to Admission medications   Medication Sig Start Date End Date Taking? Authorizing Provider  clonazePAM (KLONOPIN) 0.5 MG tablet Take 1 tablet (0.5 mg total) by mouth 2 (two) times daily as needed for anxiety. 03/10/21  Yes Susy Frizzle, MD  escitalopram (LEXAPRO) 10 MG tablet Take 10 mg by mouth daily. 05/30/21  Yes [provider]  FLUoxetine (PROZAC) 20 MG capsule Take 1 capsule (20 mg total) by mouth daily. 03/23/21  Yes Susy Frizzle, MD  gatifloxacin (ZYMAXID) 0.5 % SOLN Place 1 drop into the left eye 4 (four) times daily. 09/11/21  Yes [provider]  ketorolac (ACULAR) 0.5 % ophthalmic solution Place 1 drop into the right eye 4 (four) times daily. 11/13/21  Yes [provider]  moxifloxacin (VIGAMOX) 0.5 % ophthalmic solution Place 1 drop into the right eye 4 (four) times daily. 11/13/21  Yes [provider]  Multiple  Vitamins-Minerals (CENTRUM ADULTS PO) Take by mouth.   Yes [provider]  naproxen (NAPROSYN) 375 MG tablet Take 1 tablet (375 mg total) by mouth 2 (two) times daily. 06/02/21  Yes Susy Frizzle, MD  pravastatin (PRAVACHOL) 10 MG tablet Take 1 tablet (10 mg total) by mouth daily. 04/14/21  Yes Susy Frizzle, MD  prednisoLONE acetate (PRED FORTE) 1 % ophthalmic suspension Place 1 drop into the left eye 4 (four) times daily. 11/13/21  Yes [provider]  sulfamethoxazole-trimethoprim (BACTRIM DS) 800-160 MG tablet Take 1 tablet by mouth 2 (two) times daily for 7 days. 12/17/21 12/24/21 Yes Vevelyn Francois, NP    Family History Family History  Problem Relation Age of Onset   Cancer Father    Hypertension Mother    Cancer Brother    Heart attack Sister     Social History Social History   Tobacco Use   Smoking status: Former   Smokeless tobacco: Never  Scientific laboratory technician Use: Never used  Substance Use Topics   Alcohol use: Yes  Comment: occas   Drug use: Never     Allergies   Patient has no known allergies.   Review of Systems Review of Systems   Physical Exam Triage Vital Signs ED Triage Vitals  Enc Vitals Group     BP 12/17/21 0823 (!) 182/77     Pulse Rate 12/17/21 0823 69     Resp 12/17/21 0823 18     Temp 12/17/21 0823 97.9 F (36.6 C)     Temp Source 12/17/21 0823 Oral     SpO2 12/17/21 0823 96 %     Weight --      Height --      Head Circumference --      Peak Flow --      Pain Score 12/17/21 0821 0     Pain Loc --      Pain Edu? --      Excl. in [place]? --    No data found.  Updated Vital Signs BP (!) 182/77 (BP Location: Left Arm)   Pulse 69   Temp 97.9 F (36.6 C) (Oral)   Resp 18   SpO2 96%   Visual Acuity Right Eye Distance:   Left Eye Distance:   Bilateral Distance:    Right Eye Near:   Left Eye Near:    Bilateral Near:     Physical Exam Constitutional:      General: She is not in acute distress.     Appearance: She is obese. She is not ill-appearing, toxic-appearing or diaphoretic.  HENT:     Head: Normocephalic and atraumatic.  Cardiovascular:     Rate and Rhythm: Normal rate and regular rhythm.     Pulses: Normal pulses.     Heart sounds: Normal heart sounds.  Pulmonary:     Effort: Pulmonary effort is normal.     Breath sounds: Normal breath sounds.  Musculoskeletal:        General: Normal range of motion.  Skin:    General: Skin is warm.     Capillary Refill: Capillary refill takes less than 2 seconds.  Neurological:     General: No focal deficit present.     Mental Status: She is alert and oriented to person, place, and time.  Psychiatric:        Mood and Affect: Mood normal.        Behavior: Behavior normal.        Thought Content: Thought content normal.        Judgment: Judgment normal.      UC Treatments / Results  Labs (all labs ordered are listed, but only abnormal results are displayed) Labs Reviewed  POCT URINALYSIS DIPSTICK, ED / UC - Abnormal; Notable for the following components:      Result Value   Nitrite POSITIVE (*)    Leukocytes,Ua MODERATE (*)    All other components within normal limits    EKG   Radiology No results found.  Procedures Procedures (including critical care time)  Medications Ordered in UC Medications - No data to display  Initial Impression / Assessment and Plan / UC Course  I have reviewed the triage vital signs and the nursing notes.  Pertinent labs & imaging results that were available during my care of the patient were reviewed by me and considered in my medical decision making (see chart for details).    UTI  Culture pending Final Clinical Impressions(s) / UC Diagnoses   Final diagnoses:  Urinary tract infection without hematuria, site  unspecified     Discharge Instructions      You urinalysis was positive for Urinary Tract Infection I have started you on Bactrim 800/160 mg Twice a day for 7 days.  We  are sending your urine for a culture to make sure this is the best treatment option.  Please make sure that you complete the medication as directed.  If you experience any side effects please call our office or come back for evaluation.      ED Prescriptions     Medication Sig Dispense Auth. Provider   sulfamethoxazole-trimethoprim (BACTRIM DS) 800-160 MG tablet Take 1 tablet by mouth 2 (two) times daily for 7 days. 14 tablet Vevelyn Francois, NP      PDMP not reviewed this encounter.   Vevelyn Francois, NP 12/17/21 1969    Vevelyn Francois, NP 12/17/21 [phone]

## 2021-12-17 NOTE — Discharge Instructions (Addendum)
You urinalysis was positive for Urinary Tract Infection I have started you on Bactrim 800/160 mg Twice a day for 7 days.  We are sending your urine for a culture to make sure this is the best treatment option.  Please make sure that you complete the medication as directed.  If you experience any side effects please call our office or come back for evaluation.

## 2021-12-17 NOTE — ED Triage Notes (Signed)
Patient complains of burning with urinating and a strong smell since yesterday.

## 2021-12-25 DIAGNOSIS — C50911 Malignant neoplasm of unspecified site of right female breast: Secondary | ICD-10-CM | POA: Diagnosis not present

## 2022-01-04 ENCOUNTER — Ambulatory Visit: Payer: Medicare HMO | Admitting: Podiatry

## 2022-01-06 DIAGNOSIS — C50911 Malignant neoplasm of unspecified site of right female breast: Secondary | ICD-10-CM | POA: Diagnosis not present

## 2022-01-20 DIAGNOSIS — Z0100 Encounter for examination of eyes and vision without abnormal findings: Secondary | ICD-10-CM | POA: Diagnosis not present

## 2022-01-20 DIAGNOSIS — H5212 Myopia, left eye: Secondary | ICD-10-CM | POA: Diagnosis not present

## 2022-01-27 ENCOUNTER — Ambulatory Visit: Payer: Medicare HMO | Admitting: Podiatry

## 2022-01-27 ENCOUNTER — Encounter: Payer: Self-pay | Admitting: Podiatry

## 2022-01-27 DIAGNOSIS — M79674 Pain in right toe(s): Secondary | ICD-10-CM

## 2022-01-27 DIAGNOSIS — B351 Tinea unguium: Secondary | ICD-10-CM

## 2022-01-27 DIAGNOSIS — M79675 Pain in left toe(s): Secondary | ICD-10-CM

## 2022-01-27 DIAGNOSIS — L608 Other nail disorders: Secondary | ICD-10-CM

## 2022-01-27 NOTE — Progress Notes (Signed)

## 2022-03-10 ENCOUNTER — Telehealth: Payer: Self-pay

## 2022-03-10 NOTE — Telephone Encounter (Signed)
RX REQUEST:  Pt called with c/o a genital warts break out and asks if a medication could be sent in for her? Thank you!

## 2022-03-11 ENCOUNTER — Other Ambulatory Visit: Payer: Self-pay | Admitting: Family Medicine

## 2022-03-16 ENCOUNTER — Other Ambulatory Visit: Payer: Self-pay | Admitting: Family Medicine

## 2022-03-16 ENCOUNTER — Ambulatory Visit (INDEPENDENT_AMBULATORY_CARE_PROVIDER_SITE_OTHER): Payer: Medicare HMO | Admitting: Family Medicine

## 2022-03-16 ENCOUNTER — Encounter: Payer: Self-pay | Admitting: Family Medicine

## 2022-03-16 VITALS — BP 128/72 | HR 68 | Ht 61.0 in | Wt 250.2 lb

## 2022-03-16 DIAGNOSIS — N907 Vulvar cyst: Secondary | ICD-10-CM | POA: Diagnosis not present

## 2022-03-16 NOTE — Progress Notes (Signed)
Subjective:    Patient ID: Christina Phillips, female    DOB: [birth year], 71 y.o.   MRN: [id number]  Rash   Patient was told many years ago that she had genital warts.  She states that she has always had 1.  However last week she started noticing other lesions developing prompting her to contact my office requesting treatment for genital warts.  I asked her to come in so that I can evaluate the lesions to determine exactly what they are and to give her her best option for treatment.  She states that the lesions are otherwise asymptomatic.  They do not hurt. Past Medical History:  Diagnosis Date   Cancer [facility])    breast right, mastectomy after recurrence from lumpectomy/radiation   Hyperlipidemia    Osteoporosis  PSH- r mastectomy TAH  Family History  Problem Relation Age of Onset   Cancer Father    Hypertension Mother    Cancer Brother    Heart attack Sister     Social History   Socioeconomic History   Marital status: Divorced    Spouse name: Not on file   Number of children: 2   Years of education: Not on file   Highest education level: Not on file  Occupational History   Not on file  Tobacco Use   Smoking status: Former   Smokeless tobacco: Never  Vaping Use   Vaping Use: Never used  Substance and Sexual Activity   Alcohol use: Yes    Comment: occas   Drug use: Never   Sexual activity: Not Currently    Birth control/protection: Surgical  Other Topics Concern   Not on file  Social History Narrative   2 children.   5 grandchildren.   Social Determinants of Health   Financial Resource Strain: Low Risk  (05/15/2021)   Overall Financial Resource Strain (CARDIA)    Difficulty of Paying Living Expenses: Not hard at all  Food Insecurity: No Food Insecurity (05/15/2021)   Hunger Vital Sign    Worried About Running Out of Food in the Last Year: Never true    Ran Out of Food in the Last Year: Never true  Transportation Needs: No Transportation Needs  (05/15/2021)   PRAPARE - Hydrologist (Medical): No    Lack of Transportation (Non-Medical): No  Physical Activity: Sufficiently Active (05/15/2021)   Exercise Vital Sign    Days of Exercise per Week: 5 days    Minutes of Exercise per Session: 60 min  Stress: No Stress Concern Present (05/15/2021)   [place]    Feeling of Stress : Not at all  Social Connections: Moderately Integrated (05/15/2021)   Social Connection and Isolation Panel [NHANES]    Frequency of Communication with Friends and Family: More than three times a week    Frequency of Social Gatherings with Friends and Family: More than three times a week    Attends Religious Services: More than 4 times per year    Active Member of [employer] or Organizations: Yes    Attends Archivist Meetings: More than 4 times per year    Marital Status: Divorced  Intimate Partner Violence: Not At Risk (05/15/2021)   Humiliation, Afraid, Rape, and Kick questionnaire    Fear of Current or Ex-Partner: No    Emotionally Abused: No    Physically Abused: No    Sexually Abused: No      Review of  Systems  Skin:  Positive for rash.       Objective:   Physical Exam Constitutional:      Appearance: Normal appearance. She is normal weight.  Cardiovascular:     Rate and Rhythm: Normal rate and regular rhythm.     Pulses: Normal pulses.     Heart sounds: Normal heart sounds.  Pulmonary:     Effort: Pulmonary effort is normal.     Breath sounds: Normal breath sounds.  Genitourinary:    Labia:        Right: Rash present.        Left: Rash present.     Musculoskeletal:        General: No swelling.     Right lower leg: No edema.     Left lower leg: No edema.  Neurological:     Mental Status: She is alert.      Patient has several cystic lesions as diagrammed above on her labia.  These are sebaceous cyst.  None are infected.  They  are not vesicles.  They are not boils.  There is no sign of any genital warts     Assessment & Plan:  Sebaceous cyst of labia Patient has sebaceous cyst on her labia.  Offered the patient a referral to gynecology for surgical excision.  However she is comfortable just watching the lesions for now.

## 2022-03-29 ENCOUNTER — Other Ambulatory Visit: Payer: Self-pay

## 2022-03-29 DIAGNOSIS — M1711 Unilateral primary osteoarthritis, right knee: Secondary | ICD-10-CM

## 2022-03-29 NOTE — Telephone Encounter (Signed)
  Prescription Request  03/29/2022  Is this a "Controlled Substance" medicine? No  LOV: 03/16/2022   What is the name of the medication or equipment? pravastatin (PRAVACHOL) 10 MG tablet [[id number]]   Have you contacted your pharmacy to request a refill? Yes   Which pharmacy would you like this sent to?  [id number]- [place], [place] - [id number]: [phone]: [phone]  Patient notified that their request is being sent to the clinical staff for review and that they should receive a response within 2 business days.   Please advise at [phone]

## 2022-03-30 NOTE — Telephone Encounter (Signed)
Requested medications are due for refill today.  yes  Requested medications are on the active medications list.  yes  Last refill. 04/14/2021 #90 2 rf  Future visit scheduled.   no  Notes to clinic.  Labs are expired.    Requested Prescriptions  Pending Prescriptions Disp Refills   pravastatin (PRAVACHOL) 10 MG tablet 90 tablet 2    Sig: Take 1 tablet (10 mg total) by mouth daily.     Cardiovascular:  Antilipid - Statins Failed - 03/29/2022  1:18 PM      Failed - Lipid Panel in normal range within the last 12 months    Cholesterol  Date Value Ref Range Status  03/03/2020 186 <200 mg/dL Final   LDL Cholesterol (Calc)  Date Value Ref Range Status  03/03/2020 105 (H) mg/dL (calc) Final    Comment:    Reference range: <100 . Desirable range <100 mg/dL for primary prevention;   <70 mg/dL for patients with CHD or diabetic patients  with > or = 2 CHD risk factors. Marland Kitchen LDL-C is now calculated using the Martin-Hopkins  calculation, which is a validated novel method providing  better accuracy than the Friedewald equation in the  estimation of LDL-C.  Cresenciano Genre et al. Annamaria Helling. 6759;163(84): 2061-2068  (http://education.[facility].com/faq/FAQ164)    HDL  Date Value Ref Range Status  03/03/2020 49 (L) > OR = 50 mg/dL Final   Triglycerides  Date Value Ref Range Status  03/03/2020 199 (H) <150 mg/dL Final         Passed - Patient is not pregnant      Passed - Valid encounter within last 12 months    Recent Outpatient Visits           1 year ago Malodorous urine   [place] Pickard, Cammie Mcgee, MD   1 year ago Neuropathy   [place] Susy Frizzle, MD   1 year ago Osteoporosis, unspecified osteoporosis type, unspecified pathological fracture presence   [place] Pickard, Cammie Mcgee, MD   2 years ago Encounter for screening mammogram for malignant neoplasm of breast   [place] Pickard, Cammie Mcgee, MD

## 2022-03-31 ENCOUNTER — Telehealth: Payer: Self-pay | Admitting: Family Medicine

## 2022-03-31 MED ORDER — PRAVASTATIN SODIUM 10 MG PO TABS
10.0000 mg | ORAL_TABLET | Freq: Every day | ORAL | 0 refills | Status: DC
Start: 2022-03-31 — End: 2022-05-25

## 2022-03-31 NOTE — Telephone Encounter (Signed)
Left message to return call; requested call back to schedule annual appt with PCP for future refills.

## 2022-03-31 NOTE — Telephone Encounter (Signed)
Left message to return call; requested call back to schedule appt for future refills.

## 2022-03-31 NOTE — Telephone Encounter (Signed)
PT NEED ANNUAL APPT W/PCP FOR FUTURE REFILLS

## 2022-04-20 ENCOUNTER — Ambulatory Visit
Admission: RE | Admit: 2022-04-20 | Discharge: 2022-04-20 | Disposition: A | Discharge status: Home / Self Care | Payer: Medicare HMO | Source: Ambulatory Visit | Attending: Family Medicine | Admitting: Family Medicine

## 2022-04-20 DIAGNOSIS — Z780 Asymptomatic menopausal state: Secondary | ICD-10-CM | POA: Diagnosis not present

## 2022-04-20 DIAGNOSIS — M810 Age-related osteoporosis without current pathological fracture: Secondary | ICD-10-CM

## 2022-04-20 DIAGNOSIS — M85851 Other specified disorders of bone density and structure, right thigh: Secondary | ICD-10-CM | POA: Diagnosis not present

## 2022-05-03 ENCOUNTER — Encounter: Payer: Self-pay | Admitting: Podiatry

## 2022-05-03 ENCOUNTER — Ambulatory Visit: Payer: Medicare HMO | Admitting: Podiatry

## 2022-05-03 VITALS — BP 161/86

## 2022-05-03 DIAGNOSIS — K635 Polyp of colon: Secondary | ICD-10-CM | POA: Insufficient documentation

## 2022-05-03 DIAGNOSIS — M79675 Pain in left toe(s): Secondary | ICD-10-CM

## 2022-05-03 DIAGNOSIS — M79674 Pain in right toe(s): Secondary | ICD-10-CM

## 2022-05-03 DIAGNOSIS — Z1211 Encounter for screening for malignant neoplasm of colon: Secondary | ICD-10-CM | POA: Insufficient documentation

## 2022-05-03 DIAGNOSIS — B351 Tinea unguium: Secondary | ICD-10-CM | POA: Diagnosis not present

## 2022-05-03 DIAGNOSIS — E782 Mixed hyperlipidemia: Secondary | ICD-10-CM | POA: Insufficient documentation

## 2022-05-03 DIAGNOSIS — K5730 Diverticulosis of large intestine without perforation or abscess without bleeding: Secondary | ICD-10-CM | POA: Insufficient documentation

## 2022-05-03 DIAGNOSIS — C50919 Malignant neoplasm of unspecified site of unspecified female breast: Secondary | ICD-10-CM | POA: Insufficient documentation

## 2022-05-03 NOTE — Progress Notes (Signed)
  Subjective:  Patient ID: Christina Phillips, female    DOB: Oct 16, 1950,  MRN: [id number]  Christina Phillips presents to clinic today for painful elongated mycotic toenails 1-5 bilaterally which are tender when wearing enclosed shoe gear. Pain is relieved with periodic professional debridement.  Chief Complaint  Patient presents with   Nail Problem    [facility] PCP-Pickard, Broad[place] John PCP VST- 1 month ago   New problem(s): None.   PCP is Donita Brooks, MD.  No Known Allergies  Review of Systems: Negative except as noted in the HPI.  Objective: No changes noted in today's physical examination. Vitals:   05/03/22 1538 05/03/22 1555  BP: (!) 170/81 (!) 161/86   Christina Phillips is a pleasant 71 y.o. female morbidly obese in NAD. AAO x 3.  Neurovascular Examination: CFT <3 seconds b/l LE. Palpable pedal pulses b/l LE. Pedal hair sparse. No pain with calf compression b/l. Lower extremity skin temperature gradient within normal limits. No cyanosis or clubbing noted b/l LE.  Protective sensation intact 5/5 intact bilaterally with 10g monofilament b/l. Vibratory sensation intact b/l.  Dermatological:  Pedal integument with normal turgor, texture and tone BLE. No open wounds b/l LE. No interdigital macerations noted b/l LE. Toenails 2-5 bilaterally elongated, discolored, dystrophic, thickened, and crumbly with subungual debris and tenderness to dorsal palpation.   Pincer nail deformity bilateral great toes. No erythema, no edema, no drainage, no fluctuance. Nail border hypertrophy minimal.  Sign(s) of infection: no clinical signs of infection noted on examination today. No hyperkeratotic nor porokeratotic lesions present on today's visit.  Musculoskeletal:  Normal muscle strength 5/5 to all lower extremity muscle groups bilaterally. No pain, crepitus or joint limitation noted with ROM b/l LE. No gross bony pedal deformities b/l. Patient ambulates independently without assistive  aids.  Assessment/Plan: 1. Pain due to onychomycosis of toenails of both feet     No orders of the defined types were placed in this encounter.  -Consent given for treatment as described below: -Examined patient. -Continue supportive shoe gear daily. -Toenails 1-5 b/l were debrided in length and girth with sterile nail nippers and dremel without iatrogenic bleeding.  -Patient/POA to call should there be question/concern in the interim.   Return in about 3 months (around 08/02/2022).  Freddie Breech, DPM

## 2022-05-21 ENCOUNTER — Ambulatory Visit: Payer: Medicare HMO

## 2022-05-21 DIAGNOSIS — Z0000 Encounter for general adult medical examination without abnormal findings: Secondary | ICD-10-CM | POA: Diagnosis not present

## 2022-05-21 NOTE — Progress Notes (Signed)
Subjective:   Christina Phillips is a 72 y.o. female who presents for Medicare Annual (Subsequent) preventive examination.  Review of Systems     Cardiac Risk Factors include: advanced age (>68mn, >>68women);obesity (BMI >30kg/m2);hypertension     Objective:    There were no vitals filed for this visit. There is no height or weight on file to calculate BMI.     05/21/2022    8:43 AM 05/15/2021    9:06 AM  Advanced Directives  Does Patient Have a Medical Advance Directive? No No  Would patient like information on creating a medical advance directive?  No - Patient declined    Current Medications (verified) Outpatient Encounter Medications as of 05/21/2022  Medication Sig   FLUoxetine (PROZAC) 20 MG capsule TAKE 1 CAPSULE BY MOUTH EVERY DAY   gatifloxacin (ZYMAXID) 0.5 % SOLN Place 1 drop into the left eye 4 (four) times daily.   Multiple Vitamins-Minerals (CENTRUM ADULTS PO) Take by mouth.   naproxen (NAPROSYN) 375 MG tablet Take 1 tablet (375 mg total) by mouth 2 (two) times daily.   pravastatin (PRAVACHOL) 10 MG tablet Take 1 tablet (10 mg total) by mouth daily.   No facility-administered encounter medications on file as of 05/21/2022.    Allergies (verified) Patient has no known allergies.   History: Past Medical History:  Diagnosis Date   [place]    breast right, mastectomy after recurrence from lumpectomy/radiation   Hyperlipidemia    Past Surgical History:  Procedure Laterality Date   ABDOMINAL HYSTERECTOMY N/A    suspicious lesion   BREAST SURGERY N/A    Phreesia 02/29/2020   EYE SURGERY     Family History  Problem Relation Age of Onset   Cancer Father    Hypertension Mother    Cancer Brother    Heart attack Sister    Social History   Socioeconomic History   Marital status: Divorced    Spouse name: Not on file   Number of children: 2   Years of education: Not on file   Highest education level: Not on file  Occupational History   Not  on file  Tobacco Use   Smoking status: Former   Smokeless tobacco: Never  Vaping Use   Vaping Use: Never used  Substance and Sexual Activity   Alcohol use: Yes    Comment: occas   Drug use: Never   Sexual activity: Not Currently    Birth control/protection: Surgical  Other Topics Concern   Not on file  Social History Narrative   2 children.   5 grandchildren.   Social Determinants of Health   Financial Resource Strain: Low Risk  (05/21/2022)   Overall Financial Resource Strain (CARDIA)    Difficulty of Paying Living Expenses: Not very hard  Food Insecurity: No Food Insecurity (05/21/2022)   Hunger Vital Sign    Worried About Running Out of Food in the Last Year: Never true    Ran Out of Food in the Last Year: Never true  Transportation Needs: No Transportation Needs (05/21/2022)   PRAPARE - THydrologist(Medical): No    Lack of Transportation (Non-Medical): No  Physical Activity: Sufficiently Active (05/21/2022)   Exercise Vital Sign    Days of Exercise per Week: 5 days    Minutes of Exercise per Session: 30 min  Stress: No Stress Concern Present (05/21/2022)   [place]   Feeling of Stress :  Not at all  Social Connections: Moderately Isolated (05/21/2022)   Social Connection and Isolation Panel [NHANES]    Frequency of Communication with Friends and Family: Three times a week    Frequency of Social Gatherings with Friends and Family: Three times a week    Attends Religious Services: 1 to 4 times per year    Active Member of Clubs or Organizations: No    Attends Archivist Meetings: Never    Marital Status: Divorced    Tobacco Counseling Counseling given: Not Answered   Clinical Intake:  Pre-visit preparation completed: Yes  Pain : No/denies pain     BMI - recorded: 47.7 Nutritional Status: BMI > 30  Obese Diabetes: No  How often do you need to have someone  help you when you read instructions, pamphlets, or other written materials from your doctor or pharmacy?: 1 - Never What is the last grade level you completed in school?: 12  Diabetic?n/a  Interpreter Needed?: No      Activities of Daily Living    05/21/2022    8:49 AM 05/20/2022   11:29 AM  In your present state of health, do you have any difficulty performing the following activities:  Hearing? 1 0  Comment a little bit in both ear   Vision? 0 0  Difficulty concentrating or making decisions? 0 0  Walking or climbing stairs? 1 0  Comment sometimes on the r-knee when going up the stairs   Dressing or bathing? 0 0  Doing errands, shopping? 0 0  Preparing Food and eating ? N N  Using the Toilet? N N  In the past six months, have you accidently leaked urine? Y Y  Do you have problems with loss of bowel control? N N  Managing your Medications? N N  Managing your Finances? N N  Housekeeping or managing your Housekeeping? N N    Patient Care Team: Susy Frizzle, MD as PCP - General (Family Medicine)  Indicate any recent Medical Services you may have received from other than [facility] providers in the past year (date may be approximate).     Assessment:   This is a routine wellness examination for [name].  Hearing/Vision screen No results found.  Dietary issues and exercise activities discussed: Current Exercise Habits: Home exercise routine (walk), Type of exercise: walking, Time (Minutes): 30, Frequency (Times/Week): 5, Weekly Exercise (Minutes/Week): 150, Intensity: Mild, Exercise limited by: orthopedic condition(s)   Goals Addressed             This Visit's Progress    Exercise 3x per week (30 min per time)   On track    Continue to exercise and stay healthy       Depression Screen    05/21/2022    8:45 AM 05/15/2021    9:03 AM 04/15/2020    2:01 PM 03/03/2020    9:40 AM  PHQ 2/9 Scores  PHQ - 2 Score 0 0 0 0  PHQ- 9 Score   4     Fall Risk     05/21/2022    8:32 AM 05/20/2022   11:29 AM 05/15/2021    9:06 AM 04/15/2020    2:00 PM 03/03/2020    9:40 AM  [place] in the past year? 0 0 0 0 0  Number falls in past yr:   0 0 0  Injury with Fall?  0 0 0 0  Risk for fall due to :  Impaired balance/gait No Fall Risks   Follow up   Falls prevention discussed  Falls evaluation completed    FALL RISK PREVENTION PERTAINING TO THE HOME:  Any stairs in or around the home? Yes  If so, are there any without handrails? Yes  Home free of loose throw rugs in walkways, pet beds, electrical cords, etc? Yes  Adequate lighting in your home to reduce risk of falls? Yes   ASSISTIVE DEVICES UTILIZED TO PREVENT FALLS:  Life alert? No  Use of a cane, walker or w/c? No  Grab bars in the bathroom? Yes  Shower chair or bench in shower? Yes  Elevated toilet seat or a handicapped toilet?  Per pt has a riser on it  TIMED UP AND GO:  Was the test performed? No .  Length of time to ambulate 10 feet: n/a sec.     Cognitive Function:        05/21/2022    8:47 AM 05/15/2021    9:09 AM  6CIT Screen  What Year? 0 points 0 points  What month? 0 points 0 points  What time? 0 points 0 points  Count back from 20 0 points 0 points  Months in reverse 0 points 2 points  Repeat phrase 0 points 0 points  Total Score 0 points 2 points    Immunizations Immunization History  Administered Date(s) Administered   Influenza-Unspecified 01/25/2020, 01/24/2021   Moderna SARS-COV2 Booster Vaccination 02/17/2021   Moderna Sars-Covid-2 Vaccination 08/21/2019, 09/18/2019    TDAP status: Due, Education has been provided regarding the importance of this vaccine. Advised may receive this vaccine at local pharmacy or Health Dept. Aware to provide a copy of the vaccination record if obtained from local pharmacy or Health Dept. Verbalized acceptance and understanding.  Flu Vaccine status: Up to date  Pneumococcal vaccine status: Up to date  Covid-19  vaccine status: Completed vaccines  Qualifies for Shingles Vaccine? No   Zostavax completed No   Shingrix Completed?: No.    Education has been provided regarding the importance of this vaccine. Patient has been advised to call insurance company to determine out of pocket expense if they have not yet received this vaccine. Advised may also receive vaccine at local pharmacy or Health Dept. Verbalized acceptance and understanding.  Screening Tests Health Maintenance  Topic Date Due   Hepatitis C Screening  Never done   DTaP/Tdap/Td (1 - Tdap) Never done   Zoster Vaccines- Shingrix (1 of 2) Never done   Pneumonia Vaccine 46+ Years old (1 - PCV) Never done   COVID-19 Vaccine (3 - Moderna risk series) 03/17/2021   INFLUENZA VACCINE  12/15/2021   Medicare Annual Wellness (AWV)  05/22/2023   MAMMOGRAM  07/30/2023   COLONOSCOPY (Pts 45-41yr Insurance coverage will need to be confirmed)  09/17/2030   DEXA SCAN  Completed   HPV VACCINES  Aged Out    Health Maintenance  Health Maintenance Due  Topic Date Due   Hepatitis C Screening  Never done   DTaP/Tdap/Td (1 - Tdap) Never done   Zoster Vaccines- Shingrix (1 of 2) Never done   Pneumonia Vaccine 70 Years old (1 - PCV) Never done   COVID-19 Vaccine (3 - Moderna risk series) 03/17/2021   INFLUENZA VACCINE  12/15/2021    Colorectal cancer screening: Type of screening: Colonoscopy. Completed 09/16/20. Repeat every 10 years  Mammogram status: Completed 07/19/21. Repeat every year 2  Dexa Scan: 04/20/22 results: Mild osteopenia  Lung Cancer Screening: (Low Dose CT  Chest recommended if Age 55-80 years, 30 pack-year currently smoking OR have quit w/in 15years.) does not qualify.   Lung Cancer Screening Referral: n/a  Additional Screening:  Hepatitis C Screening: does not qualify; Completed n/a  Vision Screening: Recommended annual ophthalmology exams for early detection of glaucoma and other disorders of the eye. Is the patient up to date  with their annual eye exam?  Yes  Who is the provider or what is the name of the office in which the patient attends annual eye exams? Dr. Zenaida Niece If pt is not established with a provider, would they like to be referred to a provider to establish care? No .   Dental Screening: Recommended annual dental exams for proper oral hygiene  Community Resource Referral / Chronic Care Management: CRR required this visit?  No   CCM required this visit?  No      Plan:     I have personally reviewed and noted the following in the patient's chart:   Medical and social history Use of alcohol, tobacco or illicit drugs  Current medications and supplements including opioid prescriptions. Patient is not currently taking opioid prescriptions. Functional ability and status Nutritional status Physical activity Advanced directives List of other physicians Hospitalizations, surgeries, and ER visits in previous 12 months Vitals Screenings to include cognitive, depression, and falls Referrals and appointments  In addition, I have reviewed and discussed with patient certain preventive protocols, quality metrics, and best practice recommendations. A written personalized care plan for preventive services as well as general preventive health recommendations were provided to patient.     Colman Cater, [place]   05/21/2022   Nurse Notes: n/a

## 2022-05-24 ENCOUNTER — Other Ambulatory Visit: Payer: Self-pay | Admitting: Family Medicine

## 2022-05-24 DIAGNOSIS — M1711 Unilateral primary osteoarthritis, right knee: Secondary | ICD-10-CM

## 2022-05-25 NOTE — Telephone Encounter (Signed)
Requested medication (s) are due for refill today: routing for review  Requested medication (s) are on the active medication list: yes  Last refill:  03/31/22  Future visit scheduled: yes  Notes to clinic:  [place]. DX Code Needed.      Requested Prescriptions  Pending Prescriptions Disp Refills   pravastatin (PRAVACHOL) 10 MG tablet [Pharmacy Med Name: PRAVASTATIN SODIUM 10 MG TAB] 90 tablet 1    Sig: TAKE 1 TABLET BY MOUTH EVERY DAY     Cardiovascular:  Antilipid - Statins Failed - 05/24/2022 12:32 PM      Failed - Valid encounter within last 12 months    Recent Outpatient Visits           1 year ago Malodorous urine   [place] Pickard, Cammie Mcgee, MD   1 year ago Neuropathy   [place] Susy Frizzle, MD   1 year ago Osteoporosis, unspecified osteoporosis type, unspecified pathological fracture presence   [place] Pickard, Cammie Mcgee, MD   2 years ago Encounter for screening mammogram for malignant neoplasm of breast   [place] Susy Frizzle, MD              Failed - Lipid Panel in normal range within the last 12 months    Cholesterol  Date Value Ref Range Status  03/03/2020 186 <200 mg/dL Final   LDL Cholesterol (Calc)  Date Value Ref Range Status  03/03/2020 105 (H) mg/dL (calc) Final    Comment:    Reference range: <100 . Desirable range <100 mg/dL for primary prevention;   <70 mg/dL for patients with CHD or diabetic patients  with > or = 2 CHD risk factors. Marland Kitchen LDL-C is now calculated using the Martin-Hopkins  calculation, which is a validated novel method providing  better accuracy than the Friedewald equation in the  estimation of LDL-C.  Cresenciano Genre et al. Annamaria Helling. 4627;035(00): 2061-2068  (http://education.[facility].com/faq/FAQ164)    HDL  Date Value Ref Range Status  03/03/2020 49 (L) > OR = 50 mg/dL Final   Triglycerides  Date Value Ref  Range Status  03/03/2020 199 (H) <150 mg/dL Final         Passed - Patient is not pregnant

## 2022-05-25 NOTE — Telephone Encounter (Signed)
Requested medication (s) are due for refill today:routing for review  Requested medication (s) are on the active medication list: yes  Last refill:  06/02/21  Future visit scheduled: yes  Notes to clinic:  Unable to refill per protocol due to failed labs, no updated results.      Requested Prescriptions  Pending Prescriptions Disp Refills   naproxen (NAPROSYN) 375 MG tablet [Pharmacy Med Name: NAPROXEN 375 MG TABLET] 20 tablet 3    Sig: TAKE 1 TABLET BY MOUTH 2 TIMES DAILY.     Analgesics:  NSAIDS Failed - 05/24/2022 12:25 PM      Failed - Manual Review: Labs are only required if the patient has taken medication for more than 8 weeks.      Failed - Cr in normal range and within 360 days    Creat  Date Value Ref Range Status  12/04/2020 0.82 0.50 - 1.05 mg/dL Final         Failed - HGB in normal range and within 360 days    Hemoglobin  Date Value Ref Range Status  12/04/2020 14.5 11.7 - 15.5 g/dL Final         Failed - PLT in normal range and within 360 days    Platelets  Date Value Ref Range Status  12/04/2020 246 140 - 400 Thousand/uL Final         Failed - HCT in normal range and within 360 days    HCT  Date Value Ref Range Status  12/04/2020 44.9 35.0 - 45.0 % Final         Failed - eGFR is 30 or above and within 360 days    GFR, Est African American  Date Value Ref Range Status  03/03/2020 82 > OR = 60 mL/min/1.69m Final   GFR, Est Non African American  Date Value Ref Range Status  03/03/2020 71 > OR = 60 mL/min/1.768mFinal   eGFR  Date Value Ref Range Status  12/04/2020 78 > OR = 60 mL/min/1.7367minal    Comment:    The eGFR is based on the CKD-EPI 2021 equation. To calculate  the new eGFR from a previous Creatinine or Cystatin C result, go to https://www.kidney.org/professionals/ kdoqi/gfr%5Fcalculator          Failed - Valid encounter within last 12 months    Recent Outpatient Visits           1 year ago Malodorous urine   [facility], War[facility]ammie McgeeD   1 year ago Neuropathy   [facility] McgeeD   1 year ago Osteoporosis, unspecified osteoporosis type, unspecified pathological fracture presence   Bro[facility]ard, WarCammie McgeeD   2 years ago Encounter for screening mammogram for malignant neoplasm of breast   Bro[facility]aytonarCammie McgeeD              Passed - Patient is not pregnant

## 2022-06-03 NOTE — Progress Notes (Signed)
Virtual Visit via Telephone Note  I connected with Christina Phillips on 06/03/22 at  9:00 AM EST by telephone and verified that I am speaking with the correct person using two identifiers.  Location: Patient: home Provider: office clinic-[facility]   I discussed the limitations, risks, security and privacy concerns of performing an evaluation and management service by telephone and the availability of in person appointments. I also discussed with the patient that there may be a patient responsible charge related to this service. The patient expressed understanding and agreed to proceed.   History of Present Illness:    Observations/Objective:   Assessment and Plan:   Follow Up Instructions:    I discussed the assessment and treatment plan with the patient. The patient was provided an opportunity to ask questions and all were answered. The patient agreed with the plan and demonstrated an understanding of the instructions.   The patient was advised to call back or seek an in-person evaluation if the symptoms worsen or if the condition fails to improve as anticipated.  I provided 30 minutes of non-face-to-face time during this encounter.   Colman Cater, CMA

## 2022-06-03 NOTE — Addendum Note (Signed)
Addended by: Colman Cater on: 06/03/2022 11:52 AM   Modules accepted: Level of Service

## 2022-06-08 NOTE — Progress Notes (Signed)
I have collaborated with the care management provider regarding care management and care coordination activities outlined in this encounter and have reviewed this encounter including documentation in the note and care plan. I am certifying that I agree with the content of this note and encounter as supervising physician.  

## 2022-08-01 ENCOUNTER — Emergency Department (HOSPITAL_COMMUNITY)
Admission: EM | Admit: 2022-08-01 | Discharge: 2022-08-01 | Disposition: A | Discharge status: Skilled Nursing Facility | Payer: Medicare HMO | Attending: Emergency Medicine | Admitting: Emergency Medicine

## 2022-08-01 ENCOUNTER — Emergency Department (HOSPITAL_COMMUNITY): Payer: Medicare HMO

## 2022-08-01 DIAGNOSIS — R42 Dizziness and giddiness: Secondary | ICD-10-CM | POA: Diagnosis not present

## 2022-08-01 DIAGNOSIS — I6523 Occlusion and stenosis of bilateral carotid arteries: Secondary | ICD-10-CM | POA: Diagnosis not present

## 2022-08-01 DIAGNOSIS — R791 Abnormal coagulation profile: Secondary | ICD-10-CM | POA: Diagnosis not present

## 2022-08-01 DIAGNOSIS — R110 Nausea: Secondary | ICD-10-CM | POA: Insufficient documentation

## 2022-08-01 DIAGNOSIS — I10 Essential (primary) hypertension: Secondary | ICD-10-CM | POA: Diagnosis not present

## 2022-08-01 LAB — I-STAT CHEM 8, ED
BUN: 19 mg/dL (ref 8–23)
Calcium, Ion: 1.08 mmol/L — ABNORMAL LOW (ref 1.15–1.40)
Chloride: 105 mmol/L (ref 98–111)
Creatinine, Ser: 0.8 mg/dL (ref 0.44–1.00)
Glucose, Bld: 104 mg/dL — ABNORMAL HIGH (ref 70–99)
HCT: 41 % (ref 36.0–46.0)
Hemoglobin: 13.9 g/dL (ref 12.0–15.0)
Potassium: 3.9 mmol/L (ref 3.5–5.1)
Sodium: 142 mmol/L (ref 135–145)
TCO2: 25 mmol/L (ref 22–32)

## 2022-08-01 LAB — COMPREHENSIVE METABOLIC PANEL
ALT: 16 U/L (ref 0–44)
AST: 18 U/L (ref 15–41)
Albumin: 4.3 g/dL (ref 3.5–5.0)
Alkaline Phosphatase: 54 U/L (ref 38–126)
Anion gap: 5 (ref 5–15)
BUN: 20 mg/dL (ref 8–23)
CO2: 26 mmol/L (ref 22–32)
Calcium: 8.5 mg/dL — ABNORMAL LOW (ref 8.9–10.3)
Chloride: 109 mmol/L (ref 98–111)
Creatinine, Ser: 0.83 mg/dL (ref 0.44–1.00)
GFR, Estimated: >60 mL/min (ref >60)
Glucose, Bld: 108 mg/dL — ABNORMAL HIGH (ref 70–99)
Potassium: 3.8 mmol/L (ref 3.5–5.1)
Sodium: 140 mmol/L (ref 135–145)
Total Bilirubin: 0.6 mg/dL (ref 0.3–1.2)
Total Protein: 7.1 g/dL (ref 6.5–8.1)

## 2022-08-01 LAB — CBC
HCT: 43.7 % (ref 36.0–46.0)
Hemoglobin: 13.7 g/dL (ref 12.0–15.0)
MCH: 29.5 pg (ref 26.0–34.0)
MCHC: 31.4 g/dL (ref 30.0–36.0)
MCV: 94 fL (ref 80.0–100.0)
Platelets: 247 10*3/uL (ref 150–400)
RBC: 4.65 MIL/uL (ref 3.87–5.11)
RDW: 13.1 % (ref 11.5–15.5)
WBC: 7 10*3/uL (ref 4.0–10.5)
nRBC: 0 % (ref 0.0–0.2)

## 2022-08-01 LAB — ETHANOL: Alcohol, Ethyl (B): <10 mg/dL (ref <10)

## 2022-08-01 LAB — DIFFERENTIAL
Abs Immature Granulocytes: 0.02 10*3/uL (ref 0.00–0.07)
Basophils Absolute: 0 10*3/uL (ref 0.0–0.1)
Basophils Relative: 1 %
Eosinophils Absolute: 0.2 10*3/uL (ref 0.0–0.5)
Eosinophils Relative: 3 %
Immature Granulocytes: 0 %
Lymphocytes Relative: 16 %
Lymphs Abs: 1.1 10*3/uL (ref 0.7–4.0)
Monocytes Absolute: 0.5 10*3/uL (ref 0.1–1.0)
Monocytes Relative: 7 %
Neutro Abs: 5.2 10*3/uL (ref 1.7–7.7)
Neutrophils Relative %: 73 %

## 2022-08-01 LAB — PROTIME-INR
INR: 1 (ref 0.8–1.2)
Prothrombin Time: 13.5 seconds (ref 11.4–15.2)

## 2022-08-01 LAB — APTT: aPTT: 25 seconds (ref 24–36)

## 2022-08-01 MED ORDER — ONDANSETRON 8 MG PO TBDP: 8.0000 mg | ORAL_TABLET | Freq: Three times a day (TID) | ORAL | 0 refills | Status: DC | PRN

## 2022-08-01 MED ORDER — MECLIZINE HCL 25 MG PO TABS: 25.0000 mg | ORAL_TABLET | Freq: Three times a day (TID) | ORAL | 0 refills | Status: DC | PRN

## 2022-08-01 MED ORDER — SODIUM CHLORIDE 0.9 % IV SOLN
100.0000 mL/h | INTRAVENOUS | Status: DC
  Administered 2022-08-01: 100 mL/h via INTRAVENOUS

## 2022-08-01 MED ORDER — MECLIZINE HCL 25 MG PO TABS
25.0000 mg | ORAL_TABLET | Freq: Once | ORAL | Status: AC
  Administered 2022-08-01: 25 mg via ORAL
  Filled 2022-08-01: qty 1

## 2022-08-01 MED ORDER — SODIUM CHLORIDE 0.9 % IV BOLUS
500.0000 mL | Freq: Once | INTRAVENOUS | Status: AC
  Administered 2022-08-01: 500 mL via INTRAVENOUS

## 2022-08-01 MED ORDER — IOHEXOL 350 MG/ML SOLN
100.0000 mL | Freq: Once | INTRAVENOUS | Status: AC | PRN
  Administered 2022-08-01: 100 mL via INTRAVENOUS

## 2022-08-01 NOTE — ED Triage Notes (Signed)
Pt BIBA from [facility] for vertigo upon waking around 0630. Went to bed 9p last night, felt fine. Room spinning, nausea. Denies vision changes, tingling, pain. No sick contacts. Checked BP at home, 220/79, EMS got 220/80 manual, came down to 170/100  HR 70 NSR CBG 130

## 2022-08-01 NOTE — ED Notes (Signed)
Patient transported to MRI 

## 2022-08-01 NOTE — ED Provider Notes (Signed)
[place] EMERGENCY DEPARTMENT AT [facility] Provider Note   CSN: [id number] Arrival date & time: 08/01/22  [id number]     History {Add pertinent medical, surgical, social history, OB history to HPI:1} Chief Complaint  Patient presents with   Dizziness    Christina Phillips is a 72 y.o. female.   Dizziness    Patient presents to the emergency room with acute onset of dizziness.  Patient states she went to bed last night around 9 PM and felt fine.  When she woke up this morning she felt that the room was spinning.  It continues even while she is at rest.  She does not feel like it gets particularly worse when she is moving around.  Patient is having some nausea but no vomiting.  She denies any focal numbness or weakness.  No trouble with her vision.  Patient did check her blood pressure this morning and was elevated.  Home Medications Prior to Admission medications   Medication Sig Start Date End Date Taking? Authorizing Provider  FLUoxetine (PROZAC) 20 MG capsule TAKE 1 CAPSULE BY MOUTH EVERY DAY 03/16/22   Susy Frizzle, MD  gatifloxacin (ZYMAXID) 0.5 % SOLN Place 1 drop into the left eye 4 (four) times daily. 09/11/21   [provider]  Multiple Vitamins-Minerals (CENTRUM ADULTS PO) Take by mouth.    [provider]  naproxen (NAPROSYN) 375 MG tablet TAKE 1 TABLET BY MOUTH 2 TIMES DAILY. 05/25/22   Susy Frizzle, MD  pravastatin (PRAVACHOL) 10 MG tablet TAKE 1 TABLET BY MOUTH EVERY DAY 05/25/22   Susy Frizzle, MD      Allergies    Patient has no known allergies.    Review of Systems   Review of Systems  Neurological:  Positive for dizziness.    Physical Exam Updated Vital Signs BP (!) 138/100 (BP Location: Left Arm)   Pulse 64   Temp 97.8 F (36.6 C) (Oral)   Resp 18   Ht 1.626 m (5\' 4" )   Wt 113.4 kg   SpO2 95%   BMI 42.91 kg/m  Physical Exam Vitals and nursing note reviewed.  Constitutional:      General: She is not in  acute distress.    Appearance: She is well-developed.  HENT:     Head: Normocephalic and atraumatic.     Right Ear: External ear normal.     Left Ear: External ear normal.  Eyes:     General: No visual field deficit or scleral icterus.       Right eye: No discharge.        Left eye: No discharge.     Extraocular Movements:     Right eye: Nystagmus present.     Left eye: Nystagmus present.     Conjunctiva/sclera: Conjunctivae normal.  Neck:     Trachea: No tracheal deviation.  Cardiovascular:     Rate and Rhythm: Normal rate and regular rhythm.  Pulmonary:     Effort: Pulmonary effort is normal. No respiratory distress.     Breath sounds: Normal breath sounds. No stridor. No wheezing or rales.  Abdominal:     General: Bowel sounds are normal. There is no distension.     Palpations: Abdomen is soft.     Tenderness: There is no abdominal tenderness. There is no guarding or rebound.  Musculoskeletal:        General: No tenderness.     Cervical back: Neck supple.  Skin:  General: Skin is warm and dry.     Findings: No rash.  Neurological:     Mental Status: She is alert and oriented to person, place, and time.     Cranial Nerves: No cranial nerve deficit, dysarthria or facial asymmetry.     Sensory: No sensory deficit.     Motor: No abnormal muscle tone, seizure activity or pronator drift.     Coordination: Coordination normal.     Comments:  able to hold both legs off bed for 5 seconds, sensation intact in all extremities,  no left or right sided neglect, normal finger-nose exam ,   Psychiatric:        Mood and Affect: Mood normal.     ED Results / Procedures / Treatments   Labs (all labs ordered are listed, but only abnormal results are displayed) Labs Reviewed - No data to display  EKG None  Radiology No results found.  Procedures Procedures  {Document cardiac monitor, telemetry assessment procedure when appropriate:1}  Medications Ordered in ED Medications   sodium chloride 0.9 % bolus 500 mL (has no administration in time range)    Followed by  0.9 %  sodium chloride infusion (has no administration in time range)    ED Course/ Medical Decision Making/ A&P   {   Click here for ABCD2, HEART and other calculatorsREFRESH Note before signing :1}                          Medical Decision Making Amount and/or Complexity of Data Reviewed Labs: ordered. Radiology: ordered.  Risk Prescription drug management.   ***  {Document critical care time when appropriate:1} {Document review of labs and clinical decision tools ie heart score, Chads2Vasc2 etc:1}  {Document your independent review of radiology images, and any outside records:1} {Document your discussion with family members, caretakers, and with consultants:1} {Document social determinants of health affecting pt's care:1} {Document your decision making why or why not admission, treatments were needed:1} Final Clinical Impression(s) / ED Diagnoses Final diagnoses:  None    Rx / DC Orders ED Discharge Orders     None

## 2022-08-01 NOTE — Discharge Instructions (Signed)
Tests in the emergency room did not show any signs of stroke or other acute abnormality in the brain.  Your symptoms are most likely related to peripheral vertigo.  Take the meclizine and Zofran to help with the dizziness and nausea.  You can also try doing the Epley maneuvers.  I have included these instructions in your discharge paperwork.  Follow-up with your primary care doctor later this week to be rechecked if the symptoms have not resolved

## 2022-08-04 ENCOUNTER — Telehealth: Payer: Self-pay

## 2022-08-04 NOTE — Telephone Encounter (Signed)
     Patient  visit on 08/01/2022  at [facility] was for vertigo.  Have you been able to follow up with your primary care physician? Patient has an upcoming appointment.  The patient was or was not able to obtain any needed medicine or equipment. Patient was able to obtain medications.  Are there diet recommendations that you are having difficulty following? No  Patient expresses understanding of discharge instructions and education provided has no other needs at this time. Yes   [place] Resource Care Guide   [email or web address]  ?? [id number]   Website: [email or web address]  [email or web address]

## 2022-08-05 ENCOUNTER — Encounter: Payer: Self-pay | Admitting: Family Medicine

## 2022-08-05 ENCOUNTER — Ambulatory Visit (INDEPENDENT_AMBULATORY_CARE_PROVIDER_SITE_OTHER): Payer: Medicare HMO | Admitting: Family Medicine

## 2022-08-05 VITALS — BP 146/82 | HR 74 | Ht 64.0 in | Wt 247.0 lb

## 2022-08-05 DIAGNOSIS — I10 Essential (primary) hypertension: Secondary | ICD-10-CM

## 2022-08-05 DIAGNOSIS — Z6841 Body Mass Index (BMI) 40.0 and over, adult: Secondary | ICD-10-CM | POA: Diagnosis not present

## 2022-08-05 MED ORDER — NAPROXEN 375 MG PO TABS: 375.0000 mg | ORAL_TABLET | Freq: Two times a day (BID) | ORAL | 3 refills | Status: DC

## 2022-08-05 MED ORDER — HYDROCHLOROTHIAZIDE 25 MG PO TABS: 25.0000 mg | ORAL_TABLET | Freq: Every day | ORAL | 3 refills | Status: DC

## 2022-08-05 NOTE — Progress Notes (Signed)
Subjective:    Patient ID: Christina Phillips, female    DOB: [birth year], 72 y.o.   MRN: [id number]  Hypertension  Patient's blood pressure has recently been elevated.  She went to the emergency room for an attack of vertigo.  She was extremely scared when this happened and her blood pressure was greater than 200/100.  However at home she has been reading systolic blood pressures in the 170 range.  Here today I checked her blood pressure and found to be 148/90.   She denied chest pain short breath with me on exertion.  Past Medical History:  Diagnosis Date   Cancer [facility])    breast right, mastectomy after recurrence from lumpectomy/radiation   Hyperlipidemia    Osteoporosis  PSH- r mastectomy TAH  Family History  Problem Relation Age of Onset   Cancer Father    Hypertension Mother    Cancer Brother    Heart attack Sister     Social History   Socioeconomic History   Marital status: Divorced    Spouse name: Not on file   Number of children: 2   Years of education: Not on file   Highest education level: Not on file  Occupational History   Not on file  Tobacco Use   Smoking status: Former   Smokeless tobacco: Never  Vaping Use   Vaping Use: Never used  Substance and Sexual Activity   Alcohol use: Yes    Comment: occas   Drug use: Never   Sexual activity: Not Currently    Birth control/protection: Surgical  Other Topics Concern   Not on file  Social History Narrative   2 children.   5 grandchildren.   Social Determinants of Health   Financial Resource Strain: Low Risk  (05/21/2022)   Overall Financial Resource Strain (CARDIA)    Difficulty of Paying Living Expenses: Not very hard  Food Insecurity: No Food Insecurity (05/21/2022)   Hunger Vital Sign    Worried About Running Out of Food in the Last Year: Never true    Ran Out of Food in the Last Year: Never true  Transportation Needs: No Transportation Needs (05/21/2022)   PRAPARE - Radiographer, therapeutic (Medical): No    Lack of Transportation (Non-Medical): No  Physical Activity: Sufficiently Active (05/21/2022)   Exercise Vital Sign    Days of Exercise per Week: 5 days    Minutes of Exercise per Session: 30 min  Stress: No Stress Concern Present (05/21/2022)   [place]    Feeling of Stress : Not at all  Social Connections: Moderately Isolated (05/21/2022)   Social Connection and Isolation Panel [NHANES]    Frequency of Communication with Friends and Family: Three times a week    Frequency of Social Gatherings with Friends and Family: Three times a week    Attends Religious Services: 1 to 4 times per year    Active Member of Clubs or Organizations: No    Attends Archivist Meetings: Never    Marital Status: Divorced  Human resources officer Violence: Not At Risk (05/21/2022)   Humiliation, Afraid, Rape, and Kick questionnaire    Fear of Current or Ex-Partner: No    Emotionally Abused: No    Physically Abused: No    Sexually Abused: No      Review of Systems     Objective:   Physical Exam Constitutional:      Appearance: Normal appearance.  She is normal weight.  Cardiovascular:     Rate and Rhythm: Normal rate and regular rhythm.     Pulses: Normal pulses.     Heart sounds: Normal heart sounds.  Pulmonary:     Effort: Pulmonary effort is normal.     Breath sounds: Normal breath sounds.  Musculoskeletal:        General: No swelling.     Right lower leg: No edema.     Left lower leg: No edema.  Neurological:     Mental Status: She is alert.           Assessment & Plan:  Benign essential HTN Blood pressure is elevated.  Begin hydrochlorothiazide 25 mg daily.  Decrease sodium in diet.  Recheck blood pressure in 2 weeks

## 2022-08-09 ENCOUNTER — Other Ambulatory Visit: Payer: Self-pay | Admitting: Family Medicine

## 2022-08-09 DIAGNOSIS — Z1231 Encounter for screening mammogram for malignant neoplasm of breast: Secondary | ICD-10-CM

## 2022-08-20 ENCOUNTER — Encounter: Payer: Self-pay | Admitting: Family Medicine

## 2022-08-20 ENCOUNTER — Other Ambulatory Visit: Payer: Self-pay

## 2022-08-20 DIAGNOSIS — I10 Essential (primary) hypertension: Secondary | ICD-10-CM

## 2022-08-20 MED ORDER — LOSARTAN POTASSIUM 50 MG PO TABS: 50.0000 mg | ORAL_TABLET | Freq: Every day | ORAL | 1 refills | Status: DC

## 2022-08-21 ENCOUNTER — Other Ambulatory Visit: Payer: Self-pay | Admitting: Family Medicine

## 2022-08-21 DIAGNOSIS — M1711 Unilateral primary osteoarthritis, right knee: Secondary | ICD-10-CM

## 2022-08-23 ENCOUNTER — Ambulatory Visit: Payer: Medicare HMO | Admitting: Podiatry

## 2022-08-23 ENCOUNTER — Encounter: Payer: Self-pay | Admitting: Podiatry

## 2022-08-23 VITALS — BP 140/71 | HR 78

## 2022-08-23 DIAGNOSIS — M79674 Pain in right toe(s): Secondary | ICD-10-CM

## 2022-08-23 DIAGNOSIS — B351 Tinea unguium: Secondary | ICD-10-CM | POA: Diagnosis not present

## 2022-08-23 DIAGNOSIS — M79675 Pain in left toe(s): Secondary | ICD-10-CM

## 2022-08-23 NOTE — Progress Notes (Unsigned)
  Subjective:  Patient ID: Christina Phillips, female    DOB: February 14, 1951,  MRN: [id number]  Christina Phillips presents to clinic today for painful thick toenails that are difficult to trim. Pain interferes with ambulation. Aggravating factors include wearing enclosed shoe gear. Pain is relieved with periodic professional debridement.  Chief Complaint  Patient presents with   NAIL CARE    RFC   New problem(s): None.   PCP is Donita Brooks, MD.  No Known Allergies  Review of Systems: Negative except as noted in the HPI.  Objective: No changes noted in today's physical examination. Vitals:   08/23/22 0836  BP: (!) 140/71  Pulse: 78   Christina Mercedees Stockholm is a pleasant 72 y.o. female morbidly obese in NAD. AAO x 3.  Neurovascular Examination: CFT <3 seconds b/l LE. Palpable pedal pulses b/l LE. Pedal hair sparse. No pain with calf compression b/l. Lower extremity skin temperature gradient within normal limits. No cyanosis or clubbing noted b/l LE.  Protective sensation intact 5/5 intact bilaterally with 10g monofilament b/l. Vibratory sensation intact b/l.  Dermatological:  Pedal integument with normal turgor, texture and tone BLE. No open wounds b/l LE. No interdigital macerations noted b/l LE. Toenails 2-5 bilaterally elongated, discolored, dystrophic, thickened, and crumbly with subungual debris and tenderness to dorsal palpation.   Pincer nail deformity bilateral great toes. No erythema, no edema, no drainage, no fluctuance. Nail border hypertrophy minimal.  Sign(s) of infection: no clinical signs of infection noted on examination today. No hyperkeratotic nor porokeratotic lesions present on today's visit.  Musculoskeletal:  Normal muscle strength 5/5 to all lower extremity muscle groups bilaterally. No pain, crepitus or joint limitation noted with ROM b/l LE. No gross bony pedal deformities b/l. Patient ambulates independently without assistive  aids.  Assessment/Plan: 1. Pain due to onychomycosis of toenails of both feet     No orders of the defined types were placed in this encounter.   None -Patient was evaluated and treated. All patient's and/or POA's questions/concerns answered on today's visit. -Patient to continue soft, supportive shoe gear daily. -Mycotic toenails 1-5 bilaterally were debrided in length and girth with sterile nail nippers and dremel without incident. -Patient/POA to call should there be question/concern in the interim.   Return in about 3 months (around 11/22/2022).  Freddie Breech, DPM

## 2022-08-23 NOTE — Telephone Encounter (Signed)
Requested Prescriptions  Pending Prescriptions Disp Refills   pravastatin (PRAVACHOL) 10 MG tablet [Pharmacy Med Name: PRAVASTATIN SODIUM 10 MG TAB] 90 tablet 0    Sig: TAKE 1 TABLET BY MOUTH EVERY DAY     Cardiovascular:  Antilipid - Statins Failed - 08/21/2022  8:37 AM      Failed - Valid encounter within last 12 months    Recent Outpatient Visits           1 year ago Malodorous urine   [facility] Pickard, Priscille Heidelberg, MD   1 year ago Neuropathy   [facility] Donita Brooks, MD   2 years ago Osteoporosis, unspecified osteoporosis type, unspecified pathological fracture presence   [facility] Pickard, Priscille Heidelberg, MD   2 years ago Encounter for screening mammogram for malignant neoplasm of breast   [facility] Pickard, Priscille Heidelberg, MD       Future Appointments             In 4 weeks Tanya Nones, Priscille Heidelberg, MD [facility]            Failed - Lipid Panel in normal range within the last 12 months    Cholesterol  Date Value Ref Range Status  03/03/2020 186 <200 mg/dL Final   LDL Cholesterol (Calc)  Date Value Ref Range Status  03/03/2020 105 (H) mg/dL (calc) Final    Comment:    Reference range: <100 . Desirable range <100 mg/dL for primary prevention;   <70 mg/dL for patients with CHD or diabetic patients  with > or = 2 CHD risk factors. Marland Kitchen LDL-C is now calculated using the Martin-Hopkins  calculation, which is a validated novel method providing  better accuracy than the Friedewald equation in the  estimation of LDL-C.  Horald Pollen et al. Lenox Ahr. 6314;970(26): 2061-2068  (http://education.[facility].com/faq/FAQ164)    HDL  Date Value Ref Range Status  03/03/2020 49 (L) > OR = 50 mg/dL Final   Triglycerides  Date Value Ref Range Status  03/03/2020 199 (H) <150 mg/dL Final         Passed - Patient is not pregnant

## 2022-09-20 ENCOUNTER — Ambulatory Visit (INDEPENDENT_AMBULATORY_CARE_PROVIDER_SITE_OTHER): Payer: Medicare HMO | Admitting: Family Medicine

## 2022-09-20 VITALS — BP 126/82 | HR 63 | Temp 98.7°F | Ht 64.0 in | Wt 253.4 lb

## 2022-09-20 DIAGNOSIS — Z0000 Encounter for general adult medical examination without abnormal findings: Secondary | ICD-10-CM

## 2022-09-20 DIAGNOSIS — I10 Essential (primary) hypertension: Secondary | ICD-10-CM

## 2022-09-20 DIAGNOSIS — Z13220 Encounter for screening for lipoid disorders: Secondary | ICD-10-CM

## 2022-09-20 DIAGNOSIS — M1711 Unilateral primary osteoarthritis, right knee: Secondary | ICD-10-CM | POA: Diagnosis not present

## 2022-09-20 DIAGNOSIS — Z136 Encounter for screening for cardiovascular disorders: Secondary | ICD-10-CM | POA: Diagnosis not present

## 2022-09-20 NOTE — Progress Notes (Signed)
Subjective:    Patient ID: Christina Phillips, female    DOB: 1950-11-22, 72 y.o.   MRN: [id number]  Hypertension  Knee Pain   Patient's blood pressure at home has been around 150-166 with.  She is checking the blood pressure as soon as she gets out of bed in the morning but may be falsely elevated however I am not happy with these readings.  I recommended that the patient wait an hour and check her blood pressure.  If they are still elevated at home, I would recommend increasing the losartan 100 mg a day.  Patient will do this and let me know what readings she is getting.  She recently had a CBC and a CMP checked in March.  These were excellent except for a mild elevation in her sugar to 108.  She is trying to walk every day.  She is not drinking soda or juice.  She has a mammogram scheduled.  Her bone density test was checked last year and showed mild osteopenia.  She is taking calcium and vitamin D.  She had a colonoscopy in 2022.  They recommended a repeat colonoscopy in 2029.  Patient had a pneumonia vaccine at her pharmacy.  She is due for the shingles vaccine.  She also complains of pain in her right knee.  She is requesting a cortisone injection.  Past Medical History:  Diagnosis Date   Cancer [facility])    breast right, mastectomy after recurrence from lumpectomy/radiation   Hyperlipidemia    Osteoporosis  PSH- r mastectomy TAH  Family History  Problem Relation Age of Onset   Cancer Father    Hypertension Mother    Cancer Brother    Heart attack Sister     Social History   Socioeconomic History   Marital status: Divorced    Spouse name: Not on file   Number of children: 2   Years of education: Not on file   Highest education level: Not on file  Occupational History   Not on file  Tobacco Use   Smoking status: Former   Smokeless tobacco: Never  Vaping Use   Vaping Use: Never used  Substance and Sexual Activity   Alcohol use: Yes    Comment: occas   Drug use:  Never   Sexual activity: Not Currently    Birth control/protection: Surgical  Other Topics Concern   Not on file  Social History Narrative   2 children.   5 grandchildren.   Social Determinants of Health   Financial Resource Strain: Low Risk  (05/21/2022)   Overall Financial Resource Strain (CARDIA)    Difficulty of Paying Living Expenses: Not very hard  Food Insecurity: No Food Insecurity (05/21/2022)   Hunger Vital Sign    Worried About Running Out of Food in the Last Year: Never true    Ran Out of Food in the Last Year: Never true  Transportation Needs: No Transportation Needs (05/21/2022)   PRAPARE - Administrator, Civil Service (Medical): No    Lack of Transportation (Non-Medical): No  Physical Activity: Sufficiently Active (05/21/2022)   Exercise Vital Sign    Days of Exercise per Week: 5 days    Minutes of Exercise per Session: 30 min  Stress: No Stress Concern Present (05/21/2022)   [employer] of Occupational Health - Occupational Stress Questionnaire    Feeling of Stress : Not at all  Social Connections: Moderately Isolated (05/21/2022)   Social Connection and Isolation Panel [NHANES]  Frequency of Communication with Friends and Family: Three times a week    Frequency of Social Gatherings with Friends and Family: Three times a week    Attends Religious Services: 1 to 4 times per year    Active Member of Clubs or Organizations: No    Attends Banker Meetings: Never    Marital Status: Divorced  Catering manager Violence: Not At Risk (05/21/2022)   Humiliation, Afraid, Rape, and Kick questionnaire    Fear of Current or Ex-Partner: No    Emotionally Abused: No    Physically Abused: No    Sexually Abused: No      Review of Systems     Objective:   Physical Exam Constitutional:      Appearance: Normal appearance. She is normal weight.  Cardiovascular:     Rate and Rhythm: Normal rate and regular rhythm.     Pulses: Normal pulses.      Heart sounds: Normal heart sounds.  Pulmonary:     Effort: Pulmonary effort is normal.     Breath sounds: Normal breath sounds.  Musculoskeletal:        General: No swelling.     Right lower leg: No edema.     Left lower leg: No edema.  Neurological:     Mental Status: She is alert.           Assessment & Plan:  Screening cholesterol level - Plan: Lipid panel  Encounter for Medicare annual wellness exam  Benign essential HTN  Primary osteoarthritis of right knee I am not happy with her blood pressures at home.  If she continues to see numbers elevated greater than 140, I would increase losartan 100 mg a day and continue hydrochlorothiazide.  Her mammogram is scheduled.  Noscapine and bone density test are up-to-date.  I recommended the shingles vaccine however I would not do that the same day that we did a cortisone shot in her right knee.  She can return at any point for that.  Using sterile technique, I injected the right knee with 2 cc of lidocaine, 2 cc of Marcaine, and 2 cc of 40 mg/mL Kenalog.  I will check a fasting lipid panel.  The remainder of her preventative care is up-to-date.  Continue to encourage exercise low-carb diet and weight loss

## 2022-09-21 LAB — LIPID PANEL
Cholesterol: 208 mg/dL — ABNORMAL HIGH (ref <200)
HDL: 50 mg/dL (ref >=50)
LDL Cholesterol (Calc): 127 mg/dL (calc) — ABNORMAL HIGH
Non-HDL Cholesterol (Calc): 158 mg/dL (calc) — ABNORMAL HIGH (ref <130)
Total CHOL/HDL Ratio: 4.2 (calc) (ref <5.0)
Triglycerides: 194 mg/dL — ABNORMAL HIGH (ref <150)

## 2022-09-22 ENCOUNTER — Ambulatory Visit
Admission: RE | Admit: 2022-09-22 | Discharge: 2022-09-22 | Disposition: A | Discharge status: Home / Self Care | Payer: Medicare HMO | Source: Ambulatory Visit | Attending: Family Medicine | Admitting: Family Medicine

## 2022-09-22 ENCOUNTER — Other Ambulatory Visit: Payer: Self-pay | Admitting: Family Medicine

## 2022-09-22 DIAGNOSIS — Z1231 Encounter for screening mammogram for malignant neoplasm of breast: Secondary | ICD-10-CM

## 2022-09-23 ENCOUNTER — Ambulatory Visit: Payer: Medicare HMO

## 2022-09-29 ENCOUNTER — Other Ambulatory Visit: Payer: Self-pay

## 2022-09-29 DIAGNOSIS — E782 Mixed hyperlipidemia: Secondary | ICD-10-CM

## 2022-09-29 MED ORDER — PRAVASTATIN SODIUM 20 MG PO TABS
20.0000 mg | ORAL_TABLET | Freq: Every day | ORAL | 1 refills | Status: DC
Start: 2022-09-29 — End: 2023-03-07

## 2022-09-30 ENCOUNTER — Ambulatory Visit (INDEPENDENT_AMBULATORY_CARE_PROVIDER_SITE_OTHER): Payer: Medicare HMO

## 2022-09-30 DIAGNOSIS — Z23 Encounter for immunization: Secondary | ICD-10-CM

## 2022-10-26 ENCOUNTER — Ambulatory Visit
Admission: EM | Admit: 2022-10-26 | Discharge: 2022-10-26 | Disposition: A | Discharge status: Home / Self Care | Payer: Medicare HMO | Attending: Nurse Practitioner | Admitting: Nurse Practitioner

## 2022-10-26 DIAGNOSIS — M1711 Unilateral primary osteoarthritis, right knee: Secondary | ICD-10-CM | POA: Diagnosis not present

## 2022-10-26 MED ORDER — PREDNISONE 10 MG (21) PO TBPK
ORAL_TABLET | Freq: Every day | ORAL | 0 refills | Status: DC
Start: 2022-10-26 — End: 2023-05-19

## 2022-10-26 NOTE — ED Triage Notes (Addendum)
Pt presents to UC w/ c/o right inner knee pain x1 week. Denies direct injury to knee or falling. Naproxen does not help pain.

## 2022-10-26 NOTE — Discharge Instructions (Signed)
Start prednisone taper as prescribed Follow-up with your PCP if your symptoms do not improve Please go to the ER for any worsening symptoms

## 2022-10-26 NOTE — ED Provider Notes (Addendum)
[facility]    CSN: [id number] Arrival date & time: 10/26/22  0855      History   Chief Complaint No chief complaint on file.   HPI Christina Phillips is a 72 y.o. female presents for evaluation of knee pain.  Patient has a history of osteoarthritis of her right knee.  She did get a cortisone injection by her PCP in early May.  She states that has been helping but over the past week her medial right knee has begun to bother her primarily with walking.  Denies any swelling or bruising.  No injury.  No numbness or tingling.  She has been taking naproxen and using topical Caspian cream with some improvement.  No other concerns at this time.  HPI  Past Medical History:  Diagnosis Date   Cancer [facility])    breast right, mastectomy after recurrence from lumpectomy/radiation   Hyperlipidemia     Patient Active Problem List   Diagnosis Date Noted   Diverticular disease of colon 05/03/2022   Malignant neoplasm of female breast (HCC) 05/03/2022   Mixed hyperlipidemia 05/03/2022   Polyp of colon 05/03/2022   Screening for malignant neoplasm of colon 05/03/2022    Past Surgical History:  Procedure Laterality Date   ABDOMINAL HYSTERECTOMY N/A    suspicious lesion   BREAST SURGERY N/A    [employer]reesia 02/29/2020   EYE SURGERY      OB History     Gravida  3   Para  2   Term  2   Preterm      AB  1   Living  2      SAB  1   IAB      Ectopic      Multiple      Live Births  2            Home Medications    Prior to Admission medications   Medication Sig Start Date End Date Taking? Authorizing Provider  predniSONE (STERAPRED UNI-PAK 21 TAB) 10 MG (21) TBPK tablet Take by mouth daily. Take 6 tabs by mouth daily  for 1 day, then 5 tabs for 1 day, then 4 tabs for 1 day, then 3 tabs for 1 day, 2 tabs for 1 day, then 1 tab by mouth daily for 1 days 10/26/22  Yes Radford Pax, NP  FLUoxetine (PROZAC) 20 MG capsule TAKE 1 CAPSULE BY MOUTH EVERY DAY  03/16/22   Donita Brooks, MD  hydrochlorothiazide (HYDRODIURIL) 25 MG tablet Take 1 tablet (25 mg total) by mouth daily. 08/05/22   Donita Brooks, MD  losartan (COZAAR) 50 MG tablet Take 1 tablet (50 mg total) by mouth daily. 08/20/22   Donita Brooks, MD  meclizine (ANTIVERT) 25 MG tablet Take 1 tablet (25 mg total) by mouth 3 (three) times daily as needed for dizziness. 08/01/22   Linwood Dibbles, MD  Multiple Vitamins-Minerals (CENTRUM ADULTS PO) Take by mouth.    [provider]  naproxen (NAPROSYN) 375 MG tablet Take 1 tablet (375 mg total) by mouth 2 (two) times daily. 08/05/22   Donita Brooks, MD  ondansetron (ZOFRAN-ODT) 8 MG disintegrating tablet Take 1 tablet (8 mg total) by mouth every 8 (eight) hours as needed for nausea or vomiting. 08/01/22   Linwood Dibbles, MD  pravastatin (PRAVACHOL) 20 MG tablet Take 1 tablet (20 mg total) by mouth daily. 09/29/22   Donita Brooks, MD    Family History Family History  Problem Relation Age of Onset   Cancer Father    Hypertension Mother    Cancer Brother    Heart attack Sister     Social History Social History   Tobacco Use   Smoking status: Former   Smokeless tobacco: Never  Building services engineer Use: Never used  Substance Use Topics   Alcohol use: Yes    Comment: occas   Drug use: Never     Allergies   Patient has no known allergies.   Review of Systems Review of Systems  Musculoskeletal:        Right knee pain     Physical Exam Triage Vital Signs ED Triage Vitals  Enc Vitals Group     BP 10/26/22 0919 135/70     Pulse Rate 10/26/22 0917 74     Resp 10/26/22 0917 16     Temp 10/26/22 0917 98.5 F (36.9 C)     Temp Source 10/26/22 0917 Oral     SpO2 10/26/22 0917 95 %     Weight --      Height --      Head Circumference --      Peak Flow --      Pain Score 10/26/22 0919 7     Pain Loc --      Pain Edu? --      Excl. in GC? --    No data found.  Updated Vital Signs BP 135/70 (BP Location:  Left Arm)   Pulse 74   Temp 98.5 F (36.9 C) (Oral)   Resp 16   SpO2 95%   Visual Acuity Right Eye Distance:   Left Eye Distance:   Bilateral Distance:    Right Eye Near:   Left Eye Near:    Bilateral Near:     Physical Exam Vitals and nursing note reviewed.  Constitutional:      General: She is not in acute distress.    Appearance: Normal appearance. She is obese. She is not ill-appearing, toxic-appearing or diaphoretic.  HENT:     Head: Normocephalic and atraumatic.  Eyes:     Pupils: Pupils are equal, round, and reactive to light.  Cardiovascular:     Rate and Rhythm: Normal rate.  Pulmonary:     Effort: Pulmonary effort is normal.  Musculoskeletal:     Right knee: No swelling, deformity, effusion, erythema, ecchymosis, lacerations or bony tenderness. Normal range of motion. Tenderness present over the medial joint line. Normal patellar mobility. Normal pulse.     Comments: Negative valgus and varus stress test.  Strength 5 out of 5 bilateral lower extremities.  Skin:    General: Skin is warm and dry.  Neurological:     General: No focal deficit present.     Mental Status: She is alert and oriented to person, place, and time.  Psychiatric:        Mood and Affect: Mood normal.        Behavior: Behavior normal.      UC Treatments / Results  Labs (all labs ordered are listed, but only abnormal results are displayed) Labs Reviewed - No data to display  Comprehensive metabolic panel Order: [id number] Status: Final result     Visible to patient: Yes (not seen)     Next appt: 01/04/2023 at 10:45 AM in Podiatry Freddie Breech, DPM)   0 Result Notes  important suggestion  Newer results are available. Click to view them now.  Component Ref Range & Units 2 mo ago 1 yr ago 2 yr ago  Sodium 135 - 145 mmol/L 140 141 R 140 R  Potassium 3.5 - 5.1 mmol/L 3.8 4.3 R 4.3 R  Chloride 98 - 111 mmol/L 109 105 R 100 R  CO2 22 - 32 mmol/L 26 27 R 28 R   Glucose, Bld 70 - 99 mg/dL 119 High  73 R, CM 72 R, CM  Comment: Glucose reference range applies only to samples taken after fasting for at least 8 hours.  BUN 8 - 23 mg/dL 20 16 R 18 R  Creatinine, Ser 0.44 - 1.00 mg/dL 1.47 8.29 R 5.62 R, CM  Calcium 8.9 - 10.3 mg/dL 8.5 Low  9.7 R 13.0 R  Total Protein 6.5 - 8.1 g/dL 7.1 6.7 R 7.0 R  Albumin 3.5 - 5.0 g/dL 4.3    AST 15 - 41 U/L 18 16 R 17 R  ALT 0 - 44 U/L 16 14 R 16 R  Alkaline Phosphatase 38 - 126 U/L 54    Total Bilirubin 0.3 - 1.2 mg/dL 0.6 0.4 R 0.5 R  GFR, Estimated >60 mL/min >60    Comment: (NOTE) Calculated using the CKD-EPI Creatinine Equation (2021)  Anion gap 5 - 15 5    Comment: Performed at [facility], [address] [place], [place] [address]  Resulting Agency [facility]         Specimen Collected: 08/01/22 07:58 Last Resulted: 08/01/22 08:41           EKG   Radiology No results found.  Procedures Procedures (including critical care time)  Medications Ordered in UC Medications - No data to display  Initial Impression / Assessment and Plan / UC Course  I have reviewed the triage vital signs and the nursing notes.  Pertinent labs & imaging results that were available during my care of the patient were reviewed by me and considered in my medical decision making (see chart for details).     Exam and symptoms with patient.  Discussed knee pain likely secondary to her osteoarthritis Will do prednisone taper.  She may continue topical cast p.m. as needed Ace wrap applied for support of the joint Follow-up with PCP if symptoms do not improve Please go to the emergency room for any worsening symptoms Final Clinical Impressions(s) / UC Diagnoses   Final diagnoses:  Osteoarthritis of right knee, unspecified osteoarthritis type     Discharge Instructions      Start prednisone taper as prescribed Follow-up with your PCP if your  symptoms do not improve Please go to the ER for any worsening symptoms    ED Prescriptions     Medication Sig Dispense Auth. Provider   predniSONE (STERAPRED UNI-PAK 21 TAB) 10 MG (21) TBPK tablet Take by mouth daily. Take 6 tabs by mouth daily  for 1 day, then 5 tabs for 1 day, then 4 tabs for 1 day, then 3 tabs for 1 day, 2 tabs for 1 day, then 1 tab by mouth daily for 1 days 21 tablet Radford Pax, NP      PDMP not reviewed this encounter.   Radford Pax, NP 10/26/22 0946    Radford Pax, NP 10/26/22 [phone]

## 2022-11-12 MED ORDER — BUPIVACAINE HCL 0.25 % IJ SOLN
2.0000 mL | Freq: Once | INTRAMUSCULAR | Status: AC
Start: 2022-11-12 — End: 2022-11-12
  Administered 2022-11-12: 2 mL

## 2022-11-12 MED ORDER — TRIAMCINOLONE ACETONIDE 40 MG/ML IJ SUSP
80.0000 mg | Freq: Once | INTRAMUSCULAR | Status: AC
Start: 2022-11-12 — End: 2022-11-12
  Administered 2022-11-12: 80 mg via INTRAMUSCULAR

## 2022-11-12 MED ORDER — LIDOCAINE HCL (PF) 1 % IJ SOLN
2.0000 mL | Freq: Once | INTRAMUSCULAR | Status: AC
Start: 2022-11-12 — End: 2022-11-12
  Administered 2022-11-12: 2 mL

## 2022-11-12 NOTE — Addendum Note (Signed)
Addended by: Venia Carbon K on: 11/12/2022 05:02 PM   Modules accepted: Orders

## 2022-11-19 ENCOUNTER — Other Ambulatory Visit: Payer: Self-pay | Admitting: Family Medicine

## 2022-11-19 DIAGNOSIS — M1711 Unilateral primary osteoarthritis, right knee: Secondary | ICD-10-CM

## 2022-11-19 IMAGING — MG DIGITAL SCREENING UNILAT LEFT W/ TOMO W/ CAD
4 series · 4 of 12 positions shown · non-contrast
Comparison: Previous exam(s).

CLINICAL DATA: Screening.

EXAM:
DIGITAL SCREENING UNILATERAL LEFT MAMMOGRAM WITH CAD AND
TOMOSYNTHESIS
TECHNIQUE: Left screening digital craniocaudal and mediolateral oblique
mammograms were obtained. Left screening digital breast
tomosynthesis was performed. The images were evaluated with
computer-aided detection.

[L CC synth-2D]
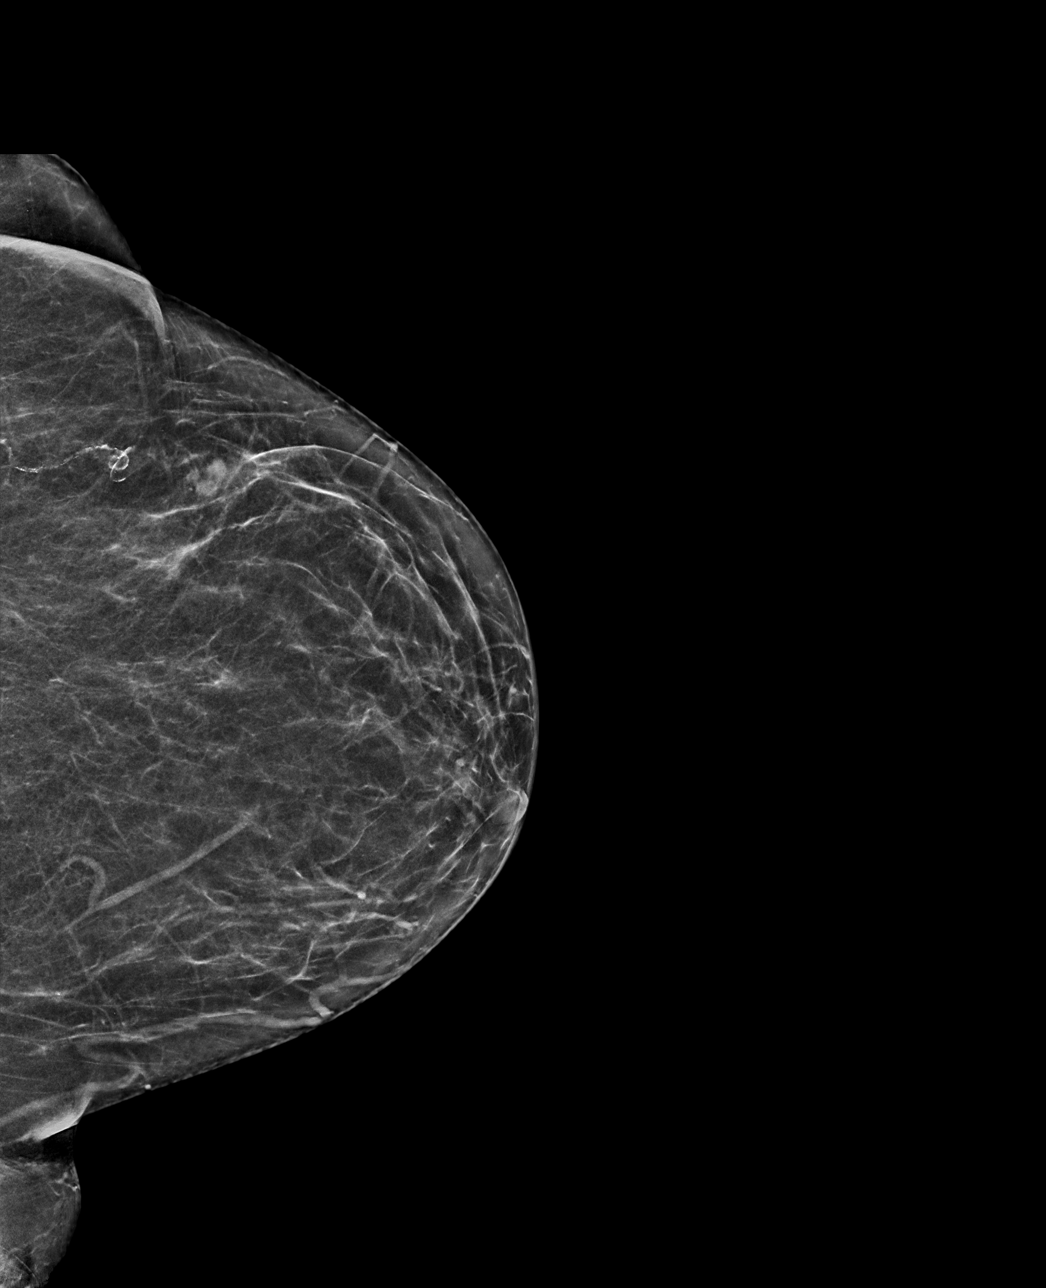

[L MLO synth-2D]
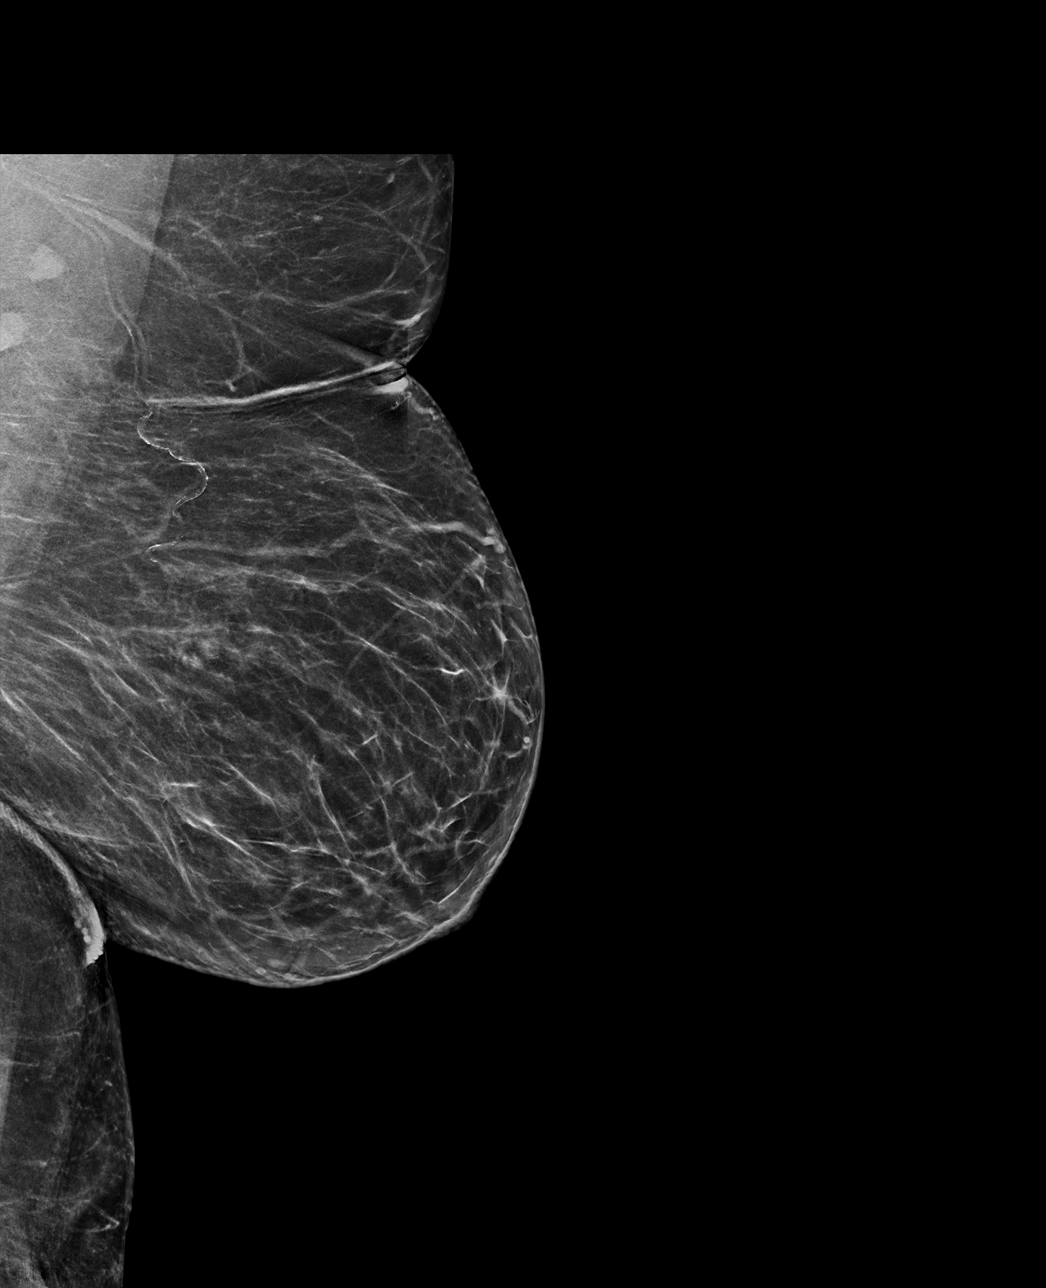

[L MLO tomo · tomo slice 35/70.0]
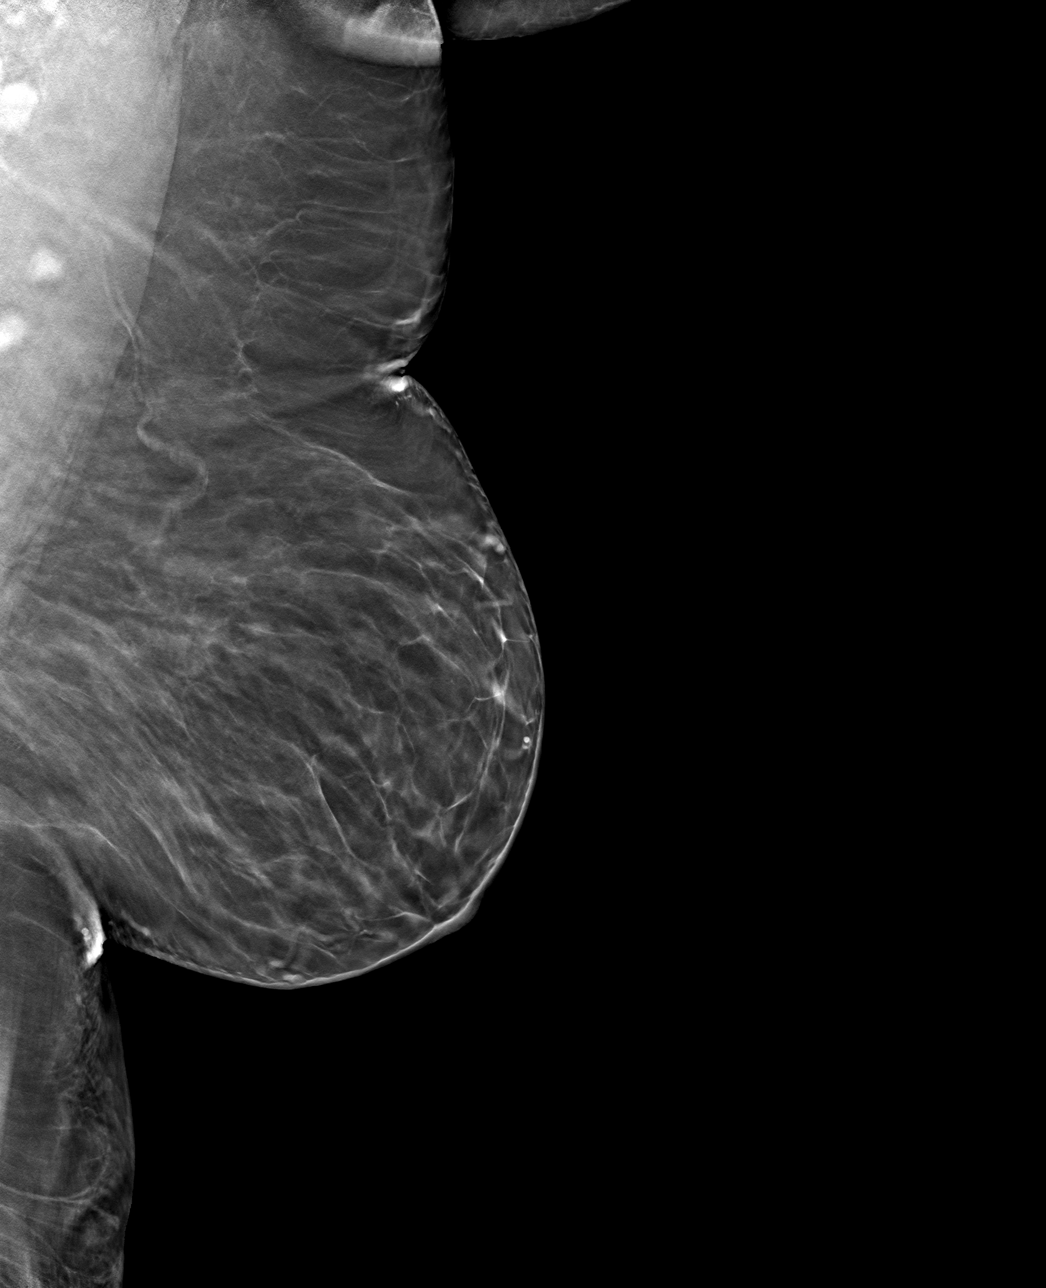

[L CC tomo · tomo slice 31/60.0]
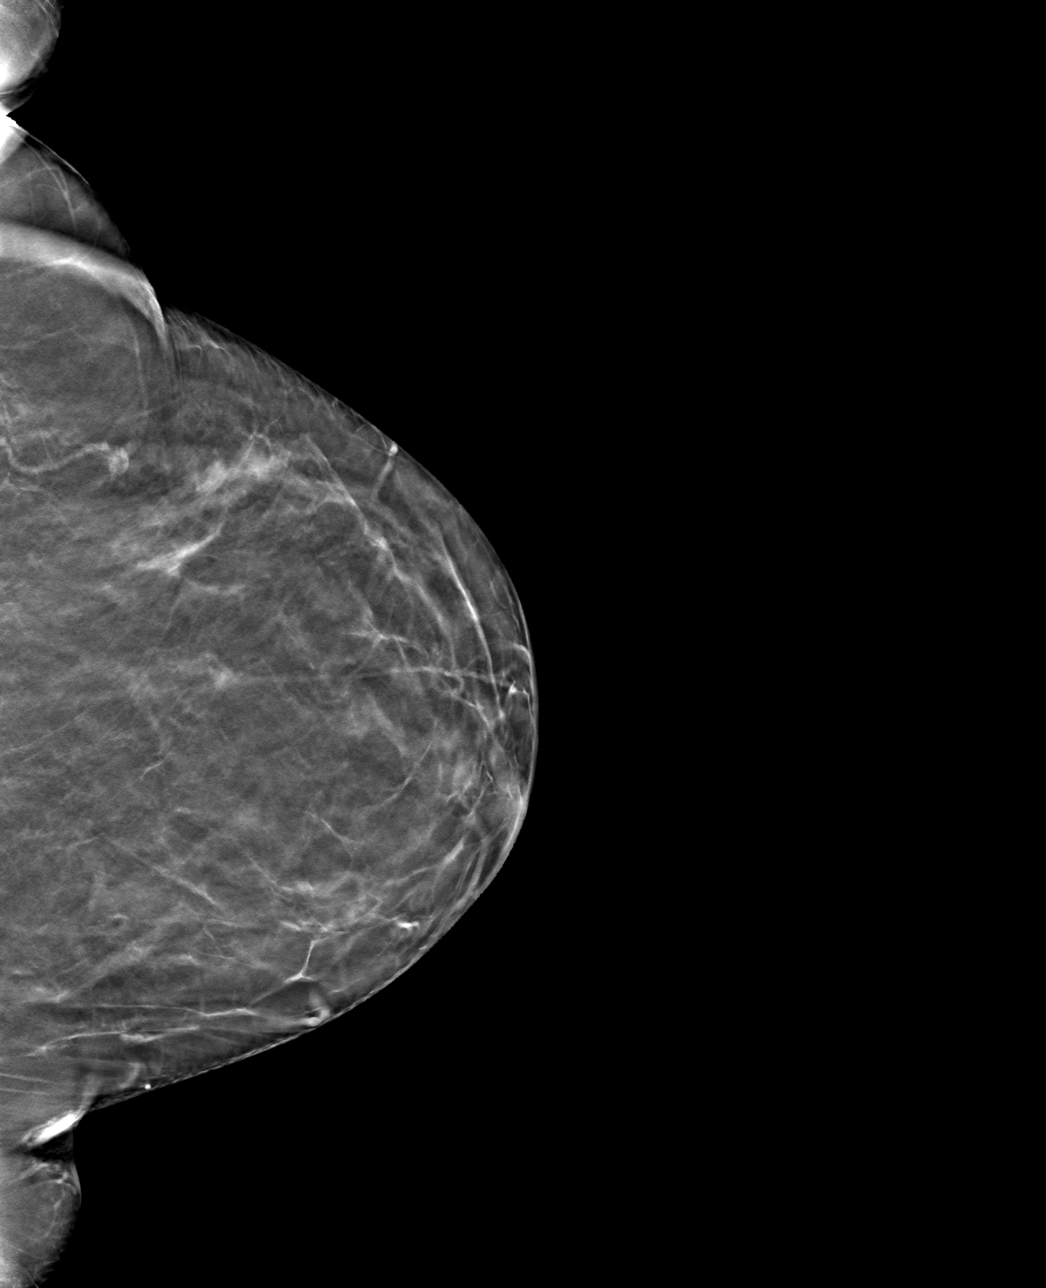

[4 of 12 positions shown; findings below may reference images not displayed]

ACR Breast Density Category b: There are scattered areas of
fibroglandular density.
FINDINGS: There are no findings suspicious for malignancy.
IMPRESSION: No mammographic evidence of malignancy. A result letter of this
screening mammogram will be mailed directly to the patient.

RECOMMENDATION:
Screening mammogram in one year. (Code:EO-6-XP6)

BI-RADS CATEGORY  1: Negative.

## 2022-11-19 NOTE — Telephone Encounter (Signed)
Unable to refill per protocol, Rx expired. Discontinued 09/29/22, dose change.  Requested Prescriptions  Pending Prescriptions Disp Refills   pravastatin (PRAVACHOL) 10 MG tablet [Pharmacy Med Name: PRAVASTATIN SODIUM 10 MG TAB] 90 tablet 0    Sig: TAKE 1 TABLET BY MOUTH EVERY DAY     Cardiovascular:  Antilipid - Statins Failed - 11/19/2022  1:59 AM      Failed - Valid encounter within last 12 months    Recent Outpatient Visits           1 year ago Malodorous urine   [facility] Pickard, Priscille Heidelberg, MD   1 year ago Neuropathy   [facility] Donita Brooks, MD   2 years ago Osteoporosis, unspecified osteoporosis type, unspecified pathological fracture presence   [facility] Pickard, Priscille Heidelberg, MD   2 years ago Encounter for screening mammogram for malignant neoplasm of breast   [facility] Donita Brooks, MD              Failed - Lipid Panel in normal range within the last 12 months    Cholesterol  Date Value Ref Range Status  09/20/2022 208 (H) <200 mg/dL Final   LDL Cholesterol (Calc)  Date Value Ref Range Status  09/20/2022 127 (H) mg/dL (calc) Final    Comment:    Reference range: <100 . Desirable range <100 mg/dL for primary prevention;   <70 mg/dL for patients with CHD or diabetic patients  with > or = 2 CHD risk factors. Marland Kitchen LDL-C is now calculated using the Martin-Hopkins  calculation, which is a validated novel method providing  better accuracy than the Friedewald equation in the  estimation of LDL-C.  Horald Pollen et al. Lenox Ahr. 1610;960(45): 2061-2068  (http://education.[facility].com/faq/FAQ164)    HDL  Date Value Ref Range Status  09/20/2022 50 > OR = 50 mg/dL Final   Triglycerides  Date Value Ref Range Status  09/20/2022 194 (H) <150 mg/dL Final         Passed - Patient is not pregnant

## 2022-12-10 ENCOUNTER — Encounter: Payer: Self-pay | Admitting: Family Medicine

## 2022-12-21 DIAGNOSIS — C50911 Malignant neoplasm of unspecified site of right female breast: Secondary | ICD-10-CM | POA: Diagnosis not present

## 2022-12-27 ENCOUNTER — Ambulatory Visit (INDEPENDENT_AMBULATORY_CARE_PROVIDER_SITE_OTHER): Payer: Medicare HMO | Admitting: Family Medicine

## 2022-12-27 ENCOUNTER — Encounter: Payer: Self-pay | Admitting: Family Medicine

## 2022-12-27 VITALS — BP 118/64 | HR 82 | Temp 98.3°F | Ht 64.0 in | Wt 252.0 lb

## 2022-12-27 DIAGNOSIS — M7582 Other shoulder lesions, left shoulder: Secondary | ICD-10-CM

## 2022-12-27 DIAGNOSIS — H9193 Unspecified hearing loss, bilateral: Secondary | ICD-10-CM | POA: Diagnosis not present

## 2022-12-27 NOTE — Progress Notes (Signed)
Subjective:    Patient ID: Christina Phillips, female    DOB: 10-Apr-1951, 72 y.o.   MRN: [id number]  Patient had a shingles shot in her left shoulder in May.  She states that ever since that time she has had an aching throbbing pain in her left shoulder.  It is especially worse with internal rotation.  It also aches and throbs at night and is slightly worse with abduction.  Response to Naprosyn and goes away temporarily.  Today on examination, she has no pain with passive abduction to 120 degrees.  She has a negative empty can sign.  However I am able to elicit pain with Hawkins maneuver.  Resisted internal rotation elicits pain.  Resisted external rotation does not cause pain.  There is no redness or swelling visible externally.  There is no crepitus with range of motion.  The patient has bilateral hearing loss.  She would like a referral to audiology to be assessed for hearing aids.  She has a cerumen impaction in her right ear however she wants to leave today and does not have time to allow [place] to remove the wax from the right ear.  I explained to the patient that they will not be able to do a full hearing evaluation without [place] removing the wax however she would like to try to do this on her own as she is pressed for time  Past Medical History:  Diagnosis Date   Cancer (HCC)    breast right, mastectomy after recurrence from lumpectomy/radiation   Hyperlipidemia    Osteoporosis  PSH- r mastectomy TAH  Family History  Problem Relation Age of Onset   Cancer Father    Hypertension Mother    Cancer Brother    Heart attack Sister     Social History   Socioeconomic History   Marital status: Divorced    Spouse name: Not on file   Number of children: 2   Years of education: Not on file   Highest education level: Not on file  Occupational History   Not on file  Tobacco Use   Smoking status: Former   Smokeless tobacco: Never  Vaping Use   Vaping status: Never Used  Substance  and Sexual Activity   Alcohol use: Yes    Comment: occas   Drug use: Never   Sexual activity: Not Currently    Birth control/protection: Surgical  Other Topics Concern   Not on file  Social History Narrative   2 children.   5 grandchildren.   Social Determinants of Health   Financial Resource Strain: Low Risk  (05/21/2022)   Overall Financial Resource Strain (CARDIA)    Difficulty of Paying Living Expenses: Not very hard  Food Insecurity: No Food Insecurity (05/21/2022)   Hunger Vital Sign    Worried About Running Out of Food in the Last Year: Never true    Ran Out of Food in the Last Year: Never true  Transportation Needs: No Transportation Needs (05/21/2022)   PRAPARE - Administrator, Civil Service (Medical): No    Lack of Transportation (Non-Medical): No  Physical Activity: Sufficiently Active (05/21/2022)   Exercise Vital Sign    Days of Exercise per Week: 5 days    Minutes of Exercise per Session: 30 min  Stress: No Stress Concern Present (05/21/2022)   [employer] of Occupational Health - Occupational Stress Questionnaire    Feeling of Stress : Not at all  Social Connections: Moderately Isolated (05/21/2022)  Social Connection and Isolation Panel [NHANES]    Frequency of Communication with Friends and Family: Three times a week    Frequency of Social Gatherings with Friends and Family: Three times a week    Attends Religious Services: 1 to 4 times per year    Active Member of Clubs or Organizations: No    Attends Banker Meetings: Never    Marital Status: Divorced  Catering manager Violence: Not At Risk (05/21/2022)   Humiliation, Afraid, Rape, and Kick questionnaire    Fear of Current or Ex-Partner: No    Emotionally Abused: No    Physically Abused: No    Sexually Abused: No      Review of Systems     Objective:   Physical Exam Constitutional:      Appearance: Normal appearance. She is normal weight.  HENT:     Right Ear: There is  impacted cerumen.     Left Ear: There is no impacted cerumen. Tympanic membrane is not injected, scarred, perforated or erythematous.  Cardiovascular:     Rate and Rhythm: Normal rate and regular rhythm.     Pulses: Normal pulses.     Heart sounds: Normal heart sounds.  Pulmonary:     Effort: Pulmonary effort is normal.     Breath sounds: Normal breath sounds.  Musculoskeletal:        General: No swelling.     Right shoulder: Normal range of motion.     Left shoulder: Tenderness present. No bony tenderness or crepitus. Normal range of motion. Normal strength.     Right lower leg: No edema.     Left lower leg: No edema.  Neurological:     Mental Status: She is alert.           Assessment & Plan:  Bilateral hearing loss, unspecified hearing loss type - Plan: Ambulatory referral to Audiology  Tendinitis of left rotator cuff Patient appears to have tendinitis and rotator cuff.  I am uncertain that was is related to the intermediate incidence.  However the pain is improving.  I offered the patient a cortisone injection in her shoulder but she politely declined that today.  Instead she would like to try Naprosyn twice daily to see if the pain will improve.  I recommended allowing [place] to remove the cerumen impaction from her ear however she does not have enough time today.  She asked me to go ahead and put in a referral to an audiologist and she will try to remove the wax on her own as she needs to leave today.

## 2023-01-04 ENCOUNTER — Ambulatory Visit: Payer: Medicare HMO | Admitting: Podiatry

## 2023-01-04 ENCOUNTER — Encounter: Payer: Self-pay | Admitting: Podiatry

## 2023-01-04 DIAGNOSIS — M79672 Pain in left foot: Secondary | ICD-10-CM

## 2023-01-04 DIAGNOSIS — B351 Tinea unguium: Secondary | ICD-10-CM

## 2023-01-04 DIAGNOSIS — M79674 Pain in right toe(s): Secondary | ICD-10-CM

## 2023-01-04 DIAGNOSIS — M79671 Pain in right foot: Secondary | ICD-10-CM | POA: Diagnosis not present

## 2023-01-04 DIAGNOSIS — M79675 Pain in left toe(s): Secondary | ICD-10-CM | POA: Diagnosis not present

## 2023-01-04 DIAGNOSIS — Q828 Other specified congenital malformations of skin: Secondary | ICD-10-CM

## 2023-01-06 DIAGNOSIS — C50911 Malignant neoplasm of unspecified site of right female breast: Secondary | ICD-10-CM | POA: Diagnosis not present

## 2023-01-09 NOTE — Progress Notes (Signed)
  Subjective:  Patient ID: Christina Phillips, female    DOB: 07/21/1950,  MRN: [id number]  Christina Phillips presents to clinic today for painful porokeratotic lesion(s) of both feet and painful mycotic toenails that limit ambulation. Painful toenails interfere with ambulation. Aggravating factors include wearing enclosed shoe gear. Pain is relieved with periodic professional debridement. Painful porokeratotic lesions are aggravated when weightbearing with and without shoegear. Pain is relieved with periodic professional debridement.  Chief Complaint  Patient presents with   RFC    RFC   New problem(s): None.   PCP is Donita Brooks, MD.  No Known Allergies  Review of Systems: Negative except as noted in the HPI.  Objective: No changes noted in today's physical examination. There were no vitals filed for this visit. Christina Phillips is a pleasant 72 y.o. female in NAD. AAO x 3.  Neurovascular Examination: CFT <3 seconds b/l LE. Palpable pedal pulses b/l LE. Pedal hair sparse. No pain with calf compression b/l. Lower extremity skin temperature gradient within normal limits. No cyanosis or clubbing noted b/l LE.  Protective sensation intact 5/5 intact bilaterally with 10g monofilament b/l. Vibratory sensation intact b/l.  Dermatological:  Pedal integument with normal turgor, texture and tone BLE. No open wounds b/l LE. No interdigital macerations noted b/l LE. Toenails 2-5 bilaterally elongated, discolored, dystrophic, thickened, and crumbly with subungual debris and tenderness to dorsal palpation.   Pincer nail deformity bilateral great toes. No erythema, no edema, no drainage, no fluctuance. Nail border hypertrophy minimal.  Sign(s) of infection: no clinical signs of infection noted on examination today.  Porokeratotic lesion(s) bilateral heels, plantar IPJ of left great toe, and submet head 5 right foot. No erythema, no edema, no drainage, no  fluctuance.  Musculoskeletal:  Normal muscle strength 5/5 to all lower extremity muscle groups bilaterally. No pain, crepitus or joint limitation noted with ROM b/l LE. No gross bony pedal deformities b/l. Patient ambulates independently without assistive aids.  Assessment/Plan: 1. Pain due to onychomycosis of toenails of both feet   2. Porokeratosis   3. Pain in both feet     -Patient was evaluated and treated. All patient's and/or POA's questions/concerns answered on today's visit. -Continue supportive shoe gear daily. -Mycotic toenails 1-5 bilaterally were debrided in length and girth with sterile nail nippers and dremel without incident. -Porokeratotic lesion(s) bilateral heels, left great toe, and submet head 5 right foot pared and enucleated with sterile currette without incident. Total number of lesions debrided=4. -Patient/POA to call should there be question/concern in the interim.   Return in about 3 months (around 04/06/2023).  Freddie Breech, DPM

## 2023-01-10 DIAGNOSIS — H524 Presbyopia: Secondary | ICD-10-CM | POA: Diagnosis not present

## 2023-01-10 DIAGNOSIS — H52223 Regular astigmatism, bilateral: Secondary | ICD-10-CM | POA: Diagnosis not present

## 2023-01-10 DIAGNOSIS — Z0100 Encounter for examination of eyes and vision without abnormal findings: Secondary | ICD-10-CM | POA: Diagnosis not present

## 2023-01-10 DIAGNOSIS — Z961 Presence of intraocular lens: Secondary | ICD-10-CM | POA: Diagnosis not present

## 2023-01-12 ENCOUNTER — Other Ambulatory Visit: Payer: Self-pay | Admitting: Family Medicine

## 2023-01-13 NOTE — Telephone Encounter (Signed)
Requested Prescriptions  Pending Prescriptions Disp Refills   FLUoxetine (PROZAC) 20 MG capsule [Pharmacy Med Name: FLUOXETINE HYDROCHLORIDE 20 MG Capsule] 90 capsule 1    Sig: TAKE 1 CAPSULE EVERY DAY     Psychiatry:  Antidepressants - SSRI Failed - 01/12/2023  4:03 AM      Failed - Valid encounter within last 6 months    Recent Outpatient Visits           1 year ago Malodorous urine   [facility] Pickard, Priscille Heidelberg, MD   2 years ago Neuropathy   [facility] Donita Brooks, MD   2 years ago Osteoporosis, unspecified osteoporosis type, unspecified pathological fracture presence   [facility] Pickard, Priscille Heidelberg, MD   2 years ago Encounter for screening mammogram for malignant neoplasm of breast   [facility] Pickard, Priscille Heidelberg, MD

## 2023-01-19 ENCOUNTER — Other Ambulatory Visit: Payer: Self-pay | Admitting: Family Medicine

## 2023-01-19 DIAGNOSIS — M1711 Unilateral primary osteoarthritis, right knee: Secondary | ICD-10-CM

## 2023-01-20 NOTE — Telephone Encounter (Signed)
discontinued on 09/29/2022 by Darral Dash, LPN - dose change   Requested Prescriptions  Refused Prescriptions Disp Refills   pravastatin (PRAVACHOL) 10 MG tablet [Pharmacy Med Name: PRAVASTATIN SODIUM 10 MG TAB] 90 tablet 0    Sig: TAKE 1 TABLET BY MOUTH EVERY DAY     Cardiovascular:  Antilipid - Statins Failed - 01/19/2023  8:00 AM      Failed - Valid encounter within last 12 months    Recent Outpatient Visits           1 year ago Malodorous urine   [facility] Pickard, Priscille Heidelberg, MD   2 years ago Neuropathy   [facility] Donita Brooks, MD   2 years ago Osteoporosis, unspecified osteoporosis type, unspecified pathological fracture presence   [facility] Pickard, Priscille Heidelberg, MD   2 years ago Encounter for screening mammogram for malignant neoplasm of breast   [facility] Pickard, Priscille Heidelberg, MD       Future Appointments             In 5 days Donita Brooks, MD [facility]            Failed - Lipid Panel in normal range within the last 12 months    Cholesterol  Date Value Ref Range Status  09/20/2022 208 (H) <200 mg/dL Final   LDL Cholesterol (Calc)  Date Value Ref Range Status  09/20/2022 127 (H) mg/dL (calc) Final    Comment:    Reference range: <100 . Desirable range <100 mg/dL for primary prevention;   <70 mg/dL for patients with CHD or diabetic patients  with > or = 2 CHD risk factors. Marland Kitchen LDL-C is now calculated using the Martin-Hopkins  calculation, which is a validated novel method providing  better accuracy than the Friedewald equation in the  estimation of LDL-C.  Horald Pollen et al. Lenox Ahr. 1191;478(29): 2061-2068  (http://education.[facility].com/faq/FAQ164)    HDL  Date Value Ref Range Status  09/20/2022 50 > OR = 50 mg/dL Final   Triglycerides  Date Value Ref Range Status  09/20/2022 194 (H) <150 mg/dL Final         Passed -  Patient is not pregnant

## 2023-01-24 ENCOUNTER — Other Ambulatory Visit: Payer: Self-pay | Admitting: Family Medicine

## 2023-01-24 DIAGNOSIS — E782 Mixed hyperlipidemia: Secondary | ICD-10-CM

## 2023-01-25 ENCOUNTER — Ambulatory Visit (INDEPENDENT_AMBULATORY_CARE_PROVIDER_SITE_OTHER): Payer: Medicare HMO | Admitting: Family Medicine

## 2023-01-25 VITALS — BP 136/88 | HR 70 | Temp 97.9°F | Ht 64.0 in | Wt 251.8 lb

## 2023-01-25 DIAGNOSIS — H6121 Impacted cerumen, right ear: Secondary | ICD-10-CM | POA: Diagnosis not present

## 2023-01-25 NOTE — Progress Notes (Signed)
Subjective:    Patient ID: Christina Phillips, female    DOB: 1950/07/15, 72 y.o.   MRN: [id number]  She has a cerumen impaction in her right ear and she is requesting that we flush out her ear.  This is affecting her hearing.  Please see last ov.    Past Medical History:  Diagnosis Date   Cancer [facility])    breast right, mastectomy after recurrence from lumpectomy/radiation   Hyperlipidemia    Osteoporosis  PSH- r mastectomy TAH  Family History  Problem Relation Age of Onset   Cancer Father    Hypertension Mother    Cancer Brother    Heart attack Sister     Social History   Socioeconomic History   Marital status: Divorced    Spouse name: Not on file   Number of children: 2   Years of education: Not on file   Highest education level: Not on file  Occupational History   Not on file  Tobacco Use   Smoking status: Former   Smokeless tobacco: Never  Vaping Use   Vaping status: Never Used  Substance and Sexual Activity   Alcohol use: Yes    Comment: occas   Drug use: Never   Sexual activity: Not Currently    Birth control/protection: Surgical  Other Topics Concern   Not on file  Social History Narrative   2 children.   5 grandchildren.   Social Determinants of Health   Financial Resource Strain: Low Risk  (05/21/2022)   Overall Financial Resource Strain (CARDIA)    Difficulty of Paying Living Expenses: Not very hard  Food Insecurity: No Food Insecurity (05/21/2022)   Hunger Vital Sign    Worried About Running Out of Food in the Last Year: Never true    Ran Out of Food in the Last Year: Never true  Transportation Needs: No Transportation Needs (05/21/2022)   PRAPARE - Administrator, Civil Service (Medical): No    Lack of Transportation (Non-Medical): No  Physical Activity: Sufficiently Active (05/21/2022)   Exercise Vital Sign    Days of Exercise per Week: 5 days    Minutes of Exercise per Session: 30 min  Stress: No Stress Concern Present  (05/21/2022)   [employer] of Occupational Health - Occupational Stress Questionnaire    Feeling of Stress : Not at all  Social Connections: Moderately Isolated (05/21/2022)   Social Connection and Isolation Panel [NHANES]    Frequency of Communication with Friends and Family: Three times a week    Frequency of Social Gatherings with Friends and Family: Three times a week    Attends Religious Services: 1 to 4 times per year    Active Member of Clubs or Organizations: No    Attends Banker Meetings: Never    Marital Status: Divorced  Catering manager Violence: Not At Risk (05/21/2022)   Humiliation, Afraid, Rape, and Kick questionnaire    Fear of Current or Ex-Partner: No    Emotionally Abused: No    Physically Abused: No    Sexually Abused: No      Review of Systems     Objective:   Physical Exam Constitutional:      Appearance: Normal appearance. She is normal weight.  HENT:     Right Ear: There is impacted cerumen.     Left Ear: There is no impacted cerumen. Tympanic membrane is not injected, scarred, perforated or erythematous.  Cardiovascular:     Rate and Rhythm:  Normal rate and regular rhythm.     Pulses: Normal pulses.     Heart sounds: Normal heart sounds.  Pulmonary:     Effort: Pulmonary effort is normal.     Breath sounds: Normal breath sounds.  Neurological:     Mental Status: She is alert.           Assessment & Plan:  Impacted cerumen of right ear Cerumen removed with irrigation and lavage.

## 2023-01-26 NOTE — Telephone Encounter (Signed)
Last reordered 09/29/22 #90 1 RF- unable to RF- pt to f/u first  Requested Prescriptions  Refused Prescriptions Disp Refills   pravastatin (PRAVACHOL) 20 MG tablet [Pharmacy Med Name: PRAVASTATIN SODIUM 20 MG TAB] 90 tablet 1    Sig: TAKE 1 TABLET BY MOUTH EVERY DAY     Cardiovascular:  Antilipid - Statins Failed - 01/24/2023  3:41 PM      Failed - Valid encounter within last 12 months    Recent Outpatient Visits           1 year ago Malodorous urine   [facility] Pickard, Priscille Heidelberg, MD   2 years ago Neuropathy   [facility] Donita Brooks, MD   2 years ago Osteoporosis, unspecified osteoporosis type, unspecified pathological fracture presence   [facility] Pickard, Priscille Heidelberg, MD   2 years ago Encounter for screening mammogram for malignant neoplasm of breast   [facility] Donita Brooks, MD              Failed - Lipid Panel in normal range within the last 12 months    Cholesterol  Date Value Ref Range Status  09/20/2022 208 (H) <200 mg/dL Final   LDL Cholesterol (Calc)  Date Value Ref Range Status  09/20/2022 127 (H) mg/dL (calc) Final    Comment:    Reference range: <100 . Desirable range <100 mg/dL for primary prevention;   <70 mg/dL for patients with CHD or diabetic patients  with > or = 2 CHD risk factors. Marland Kitchen LDL-C is now calculated using the Martin-Hopkins  calculation, which is a validated novel method providing  better accuracy than the Friedewald equation in the  estimation of LDL-C.  Horald Pollen et al. Lenox Ahr. 4403;474(25): 2061-2068  (http://education.[facility].com/faq/FAQ164)    HDL  Date Value Ref Range Status  09/20/2022 50 > OR = 50 mg/dL Final   Triglycerides  Date Value Ref Range Status  09/20/2022 194 (H) <150 mg/dL Final         Passed - Patient is not pregnant

## 2023-01-27 ENCOUNTER — Other Ambulatory Visit: Payer: Self-pay | Admitting: Family Medicine

## 2023-01-27 ENCOUNTER — Ambulatory Visit: Payer: Medicare HMO | Attending: Family Medicine | Admitting: Audiologist

## 2023-01-27 DIAGNOSIS — H918X3 Other specified hearing loss, bilateral: Secondary | ICD-10-CM

## 2023-01-27 DIAGNOSIS — H903 Sensorineural hearing loss, bilateral: Secondary | ICD-10-CM | POA: Insufficient documentation

## 2023-01-27 NOTE — Procedures (Signed)
  Outpatient Audiology and [facility] [address] [place], [place]  [address] [phone]  AUDIOLOGICAL  EVALUATION  NAME: Christina Phillips     DOB:   07/14/1950      MRN: [id number]                                                                                     DATE: 01/27/2023     REFERENT: Donita Brooks, MD STATUS: Outpatient DIAGNOSIS: Asymmetric Sensorineural Hearing Loss   History: Lizzy Shepheard was seen for an audiological evaluation. Mariteres Dewayne Hatch is receiving a hearing evaluation due to concerns for muffled hearing in the right ear. For months Valenda Mcleary has felt the right ear hearing is muffled. She had the wax removed from that ear. She still feels like it is full, and that words are unclear. She denies any pain or tinnitus in either ear. She knows she likely has loss in both ears, but its worse in the right. She is ready to try hearing aids since she is tired of asking people to repeat. Medical history shows no significant conditions with hearing loss risk.   Evaluation:  Otoscopy showed a clear view of the tympanic membranes, bilaterally Tympanometry results were consistent with normal middle ear function, bilaterally   Audiometric testing was completed using conventional audiometry with insert transducer. Speech Recognition Thresholds were  60dB in the right ear and 50dB in the left ear. Word Recognition was performed 40dB SL, scored 56% in the right ear and 96% in the left ear. Pure tone thresholds show asymmetric sensorineural hearing loss with the right ear worse. See audiogram below.   Results:  The test results were reviewed with Lindaann Slough. She has an asymmetric sensorineural hearing loss with a significant difference in her word recognition. She needs to see Otolaryngology for a medical evaluation of this asymmetry. After this, she can pursue hearing aids.     Recommendations: Amplification is necessary for both ears after plan  of care with Otolaryngology discussed. Hearing aids can be purchased from a variety of locations. See provided list for locations in the [place] area.  Referral to ENT Physician necessary due to asymmetric thresholds. See audiogram. Please refer to [facility]: Dr. Allena Katz   36 minutes spent testing and counseling on results.   Ammie Ferrier  Audiologist, Au.D., CCC-A 01/27/2023  10:59 AM  Cc: Donita Brooks, MD

## 2023-02-12 ENCOUNTER — Other Ambulatory Visit: Payer: Self-pay | Admitting: Family Medicine

## 2023-02-12 DIAGNOSIS — I10 Essential (primary) hypertension: Secondary | ICD-10-CM

## 2023-02-14 NOTE — Telephone Encounter (Signed)
Requested Prescriptions  Pending Prescriptions Disp Refills   losartan (COZAAR) 50 MG tablet [Pharmacy Med Name: LOSARTAN POTASSIUM 50 MG TAB] 90 tablet 1    Sig: TAKE 1 TABLET BY MOUTH EVERY DAY     Cardiovascular:  Angiotensin Receptor Blockers Failed - 02/12/2023  8:29 AM      Failed - Cr in normal range and within 180 days    Creat  Date Value Ref Range Status  12/04/2020 0.82 0.50 - 1.05 mg/dL Final   Creatinine, Ser  Date Value Ref Range Status  08/01/2022 0.80 0.44 - 1.00 mg/dL Final         Failed - K in normal range and within 180 days    Potassium  Date Value Ref Range Status  08/01/2022 3.9 3.5 - 5.1 mmol/L Final         Failed - Valid encounter within last 6 months    Recent Outpatient Visits           1 year ago Malodorous urine   [facility] Pickard, Priscille Heidelberg, MD   2 years ago Neuropathy   [facility] Donita Brooks, MD   2 years ago Osteoporosis, unspecified osteoporosis type, unspecified pathological fracture presence   [facility] Pickard, Priscille Heidelberg, MD   2 years ago Encounter for screening mammogram for malignant neoplasm of breast   [facility] Pickard, Priscille Heidelberg, MD              Passed - Patient is not pregnant      Passed - Last BP in normal range    BP Readings from Last 1 Encounters:  01/25/23 136/88

## 2023-02-22 ENCOUNTER — Other Ambulatory Visit: Payer: Self-pay | Admitting: Family Medicine

## 2023-03-05 ENCOUNTER — Other Ambulatory Visit: Payer: Self-pay | Admitting: Family Medicine

## 2023-03-05 DIAGNOSIS — M1711 Unilateral primary osteoarthritis, right knee: Secondary | ICD-10-CM

## 2023-03-05 DIAGNOSIS — E782 Mixed hyperlipidemia: Secondary | ICD-10-CM

## 2023-03-07 ENCOUNTER — Other Ambulatory Visit: Payer: Self-pay

## 2023-03-07 ENCOUNTER — Telehealth: Payer: Self-pay

## 2023-03-07 DIAGNOSIS — F32A Depression, unspecified: Secondary | ICD-10-CM

## 2023-03-07 MED ORDER — FLUOXETINE HCL 20 MG PO CAPS
20.0000 mg | ORAL_CAPSULE | Freq: Every day | ORAL | 1 refills | Status: DC
Start: 2023-03-07 — End: 2023-10-03

## 2023-03-07 NOTE — Telephone Encounter (Signed)
Requested Prescriptions  Pending Prescriptions Disp Refills   hydrochlorothiazide (HYDRODIURIL) 25 MG tablet [Pharmacy Med Name: HYDROCHLOROTHIAZIDE 25 MG TAB] 90 tablet 0    Sig: TAKE 1 TABLET (25 MG TOTAL) BY MOUTH DAILY.     Cardiovascular: Diuretics - Thiazide Failed - 03/05/2023  4:17 PM      Failed - Cr in normal range and within 180 days    Creat  Date Value Ref Range Status  12/04/2020 0.82 0.50 - 1.05 mg/dL Final   Creatinine, Ser  Date Value Ref Range Status  08/01/2022 0.80 0.44 - 1.00 mg/dL Final         Failed - K in normal range and within 180 days    Potassium  Date Value Ref Range Status  08/01/2022 3.9 3.5 - 5.1 mmol/L Final         Failed - Na in normal range and within 180 days    Sodium  Date Value Ref Range Status  08/01/2022 142 135 - 145 mmol/L Final         Failed - Valid encounter within last 6 months    Recent Outpatient Visits           1 year ago Malodorous urine   [facility] Pickard, Priscille Heidelberg, MD   2 years ago Neuropathy   [facility] Donita Brooks, MD   2 years ago Osteoporosis, unspecified osteoporosis type, unspecified pathological fracture presence   [facility] Pickard, Priscille Heidelberg, MD   3 years ago Encounter for screening mammogram for malignant neoplasm of breast   [facility] Pickard, Priscille Heidelberg, MD              Passed - Last BP in normal range    BP Readings from Last 1 Encounters:  01/25/23 136/88          pravastatin (PRAVACHOL) 20 MG tablet [Pharmacy Med Name: PRAVASTATIN SODIUM 20 MG TAB] 90 tablet 0    Sig: TAKE 1 TABLET BY MOUTH EVERY DAY     Cardiovascular:  Antilipid - Statins Failed - 03/05/2023  4:17 PM      Failed - Valid encounter within last 12 months    Recent Outpatient Visits           1 year ago Malodorous urine   [facility] Pickard, Priscille Heidelberg, MD   2 years ago Neuropathy   [facility] Donita Brooks, MD   2 years ago Osteoporosis, unspecified osteoporosis type, unspecified pathological fracture presence   [facility] Pickard, Priscille Heidelberg, MD   3 years ago Encounter for screening mammogram for malignant neoplasm of breast   [facility] Pickard, Priscille Heidelberg, MD              Failed - Lipid Panel in normal range within the last 12 months    Cholesterol  Date Value Ref Range Status  09/20/2022 208 (H) <200 mg/dL Final   LDL Cholesterol (Calc)  Date Value Ref Range Status  09/20/2022 127 (H) mg/dL (calc) Final    Comment:    Reference range: <100 . Desirable range <100 mg/dL for primary prevention;   <70 mg/dL for patients with CHD or diabetic patients  with > or = 2 CHD risk factors. Marland Kitchen LDL-C is now calculated using the Martin-Hopkins  calculation, which is a validated novel method providing  better accuracy than the Friedewald equation in the  estimation of LDL-C.  Horald Pollen et al. Lenox Ahr. 1610;960(45): 2061-2068  (http://education.[facility].com/faq/FAQ164)    HDL  Date Value Ref Range Status  09/20/2022 50 > OR = 50 mg/dL Final   Triglycerides  Date Value Ref Range Status  09/20/2022 194 (H) <150 mg/dL Final         Passed - Patient is not pregnant       pravastatin (PRAVACHOL) 10 MG tablet [Pharmacy Med Name: PRAVASTATIN SODIUM 10 MG TAB] 90 tablet 0    Sig: TAKE 1 TABLET BY MOUTH EVERY DAY     Cardiovascular:  Antilipid - Statins Failed - 03/05/2023  4:17 PM      Failed - Valid encounter within last 12 months    Recent Outpatient Visits           1 year ago Malodorous urine   [facility] Pickard, Priscille Heidelberg, MD   2 years ago Neuropathy   [facility] Donita Brooks, MD   2 years ago Osteoporosis, unspecified osteoporosis type, unspecified pathological fracture presence   [facility] Pickard, Priscille Heidelberg, MD   3 years ago Encounter for screening mammogram for  malignant neoplasm of breast   [facility] Pickard, Priscille Heidelberg, MD              Failed - Lipid Panel in normal range within the last 12 months    Cholesterol  Date Value Ref Range Status  09/20/2022 208 (H) <200 mg/dL Final   LDL Cholesterol (Calc)  Date Value Ref Range Status  09/20/2022 127 (H) mg/dL (calc) Final    Comment:    Reference range: <100 . Desirable range <100 mg/dL for primary prevention;   <70 mg/dL for patients with CHD or diabetic patients  with > or = 2 CHD risk factors. Marland Kitchen LDL-C is now calculated using the Martin-Hopkins  calculation, which is a validated novel method providing  better accuracy than the Friedewald equation in the  estimation of LDL-C.  Horald Pollen et al. Lenox Ahr. 4098;119(14): 2061-2068  (http://education.[facility].com/faq/FAQ164)    HDL  Date Value Ref Range Status  09/20/2022 50 > OR = 50 mg/dL Final   Triglycerides  Date Value Ref Range Status  09/20/2022 194 (H) <150 mg/dL Final         Passed - Patient is not pregnant

## 2023-03-07 NOTE — Telephone Encounter (Signed)
Prescription Request  03/07/2023  LOV: 01/25/23  What is the name of the medication or equipment? FLUoxetine (PROZAC) 20 MG capsule [[id number]]  Have you contacted your pharmacy to request a refill? Yes   Which pharmacy would you like this sent to?  [id number] - [place], [place] - [address] AT [address] OF [address] [address] [place] [address] Phone: [phone] Fax: [phone]    Patient notified that their request is being sent to the clinical staff for review and that they should receive a response within 2 business days.   Please advise at [facility] [phone]

## 2023-03-17 ENCOUNTER — Institutional Professional Consult (permissible substitution) (INDEPENDENT_AMBULATORY_CARE_PROVIDER_SITE_OTHER): Payer: Medicare HMO

## 2023-03-24 ENCOUNTER — Encounter (INDEPENDENT_AMBULATORY_CARE_PROVIDER_SITE_OTHER): Payer: Self-pay

## 2023-03-24 ENCOUNTER — Telehealth (INDEPENDENT_AMBULATORY_CARE_PROVIDER_SITE_OTHER): Payer: Self-pay | Admitting: Otolaryngology

## 2023-03-24 NOTE — Telephone Encounter (Signed)
Called patient to provide address for appointment with Dr.Patel. No answer, left mychart message

## 2023-03-25 ENCOUNTER — Encounter (INDEPENDENT_AMBULATORY_CARE_PROVIDER_SITE_OTHER): Payer: Self-pay

## 2023-03-25 ENCOUNTER — Ambulatory Visit (INDEPENDENT_AMBULATORY_CARE_PROVIDER_SITE_OTHER): Payer: Medicare HMO | Admitting: Otolaryngology

## 2023-03-25 VITALS — Ht 64.0 in | Wt 251.0 lb

## 2023-03-25 DIAGNOSIS — H903 Sensorineural hearing loss, bilateral: Secondary | ICD-10-CM

## 2023-03-25 NOTE — Progress Notes (Signed)
Dear Dr. Tanya Nones, Here is my assessment for our mutual patient, Christina Phillips. Thank you for allowing me the opportunity to care for your patient. Please do not hesitate to contact me should you have any other questions. Sincerely, Dr. Jovita Kussmaul  Otolaryngology Clinic Note Referring provider: Dr. Tanya Nones HPI:  Christina Phillips is a 72 y.o. female kindly referred by Dr. Tanya Nones for evaluation of asymmetric hearing loss.  Had it for a couple of years, but worse over last year or so. Patient reports that she has bilateral hearing loss, but reports right is worse. She had a cerumen impaction prior, but still fees like there is some muffled hearing, sometimes both sides (2-3 times per month - momentarily). She has some imbalance. She uses Meclizine or Dramamine occassionally for it. Denies tinnitus. Query ototoxic med use (did have chemo for breast cancer 20 years ago). Has had episode of vertigo in March 2024 where she had elevated BP, no other symptoms (happened when she got out of bed) - dx with BPPV, no headaches, auras, light/sound sensitivity.   Patient denies: ear pain, drainage. Patient additionally denies: deep pain in ear canal, eustachian tube symptoms such as popping, crackling, sensitive to pressure changes Patient also denies barotrauma Prior ear surgery: No No signficant prior noise exposure  H&N Surgery: no Personal or FHx of bleeding dz or anesthesia difficulty: no  AP/AC: denies  PMHx: Breast Cancer (treated 20 years ago, did get chemo), HTN, Stress No heart attacks or stroke  Tobacco: remote history. Occupation: caregiver  Independent Review of Additional Tests or Records:  PCP Notes reviewed: has had right hearing loss, had a cerumen impaction earlier in 2024, removed.  Audiogram and Visit with Ammie Ferrier, AuD reviewed (01/27/2023): M"concerns for muffled hearing in the right ear. For months Christina Phillips has felt the right ear hearing is muffled. She  had the wax removed from that ear. She still feels like it is full, and that words are unclear. She denies any pain or tinnitus in either ear. She knows she likely has loss in both ears, but its worse in the right. She is ready to try hearing aids since she is tired of asking people to repeat. Medical history shows no significant conditions with hearing loss risk.    Evaluation:  Otoscopy showed a clear view of the tympanic membranes, bilaterally Tympanometry results were consistent with normal middle ear function, bilaterally   Audiometric testing was completed using conventional audiometry with insert transducer. Speech Recognition Thresholds were  60dB in the right ear and 50dB in the left ear. Word Recognition was performed 40dB SL, scored 56% in the right ear and 96% in the left ear. Pure tone thresholds show asymmetric sensorineural hearing loss with the right ear worse. See audiogram below."  01/27/2023 Audiogram was independently reviewed and interpreted by me and it reveals Right ear: moderate downsloping to moderately severe SNHL; significantly worse than left ear; 56% word interpretation at 100dB; type A tympanogram Left ear: mild downsloping to moderately severe SNHL; 96% word interpretation at 90dB; type A tympanogram  SNHL= Sensorineural hearing loss    MRI Brain 2024: no obvious large IAC masses (T2 below)   CT Angio 2024: Mastoids well aerated b/l; cochlea with appropriate turn, no obvious pneumocochlea; no obvious large encephalocele or enlarged aqueduct; study suboptimal given angio     PMH/Meds/All/SocHx/FamHx/ROS:   Past Medical History:  Diagnosis Date   Cancer (HCC)    breast right, mastectomy after recurrence from lumpectomy/radiation  Hyperlipidemia      Past Surgical History:  Procedure Laterality Date   ABDOMINAL HYSTERECTOMY N/A    suspicious lesion   BREAST SURGERY N/A    [employer]ia 02/29/2020   EYE SURGERY      Family History  Problem Relation Age  of Onset   Cancer Father    Hypertension Mother    Cancer Brother    Heart attack Sister      Social Connections: Moderately Isolated (05/21/2022)   Social Connection and Isolation Panel [NHANES]    Frequency of Communication with Friends and Family: Three times a week    Frequency of Social Gatherings with Friends and Family: Three times a week    Attends Religious Services: 1 to 4 times per year    Active Member of Clubs or Organizations: No    Attends Banker Meetings: Never    Marital Status: Divorced      Current Outpatient Medications:    FLUoxetine (PROZAC) 20 MG capsule, Take 1 capsule (20 mg total) by mouth daily., Disp: 90 capsule, Rfl: 1   hydrochlorothiazide (HYDRODIURIL) 25 MG tablet, TAKE 1 TABLET (25 MG TOTAL) BY MOUTH DAILY., Disp: 90 tablet, Rfl: 0   losartan (COZAAR) 50 MG tablet, TAKE 1 TABLET BY MOUTH EVERY DAY, Disp: 90 tablet, Rfl: 1   meclizine (ANTIVERT) 25 MG tablet, Take 1 tablet (25 mg total) by mouth 3 (three) times daily as needed for dizziness., Disp: 30 tablet, Rfl: 0   Multiple Vitamins-Minerals (CENTRUM ADULTS PO), Take by mouth., Disp: , Rfl:    naproxen (NAPROSYN) 375 MG tablet, TAKE 1 TABLET BY MOUTH 2 TIMES DAILY., Disp: 20 tablet, Rfl: 3   ondansetron (ZOFRAN-ODT) 8 MG disintegrating tablet, Take 1 tablet (8 mg total) by mouth every 8 (eight) hours as needed for nausea or vomiting., Disp: 12 tablet, Rfl: 0   pravastatin (PRAVACHOL) 20 MG tablet, TAKE 1 TABLET BY MOUTH EVERY DAY, Disp: 90 tablet, Rfl: 0   predniSONE (STERAPRED UNI-PAK 21 TAB) 10 MG (21) TBPK tablet, Take by mouth daily. Take 6 tabs by mouth daily  for 1 day, then 5 tabs for 1 day, then 4 tabs for 1 day, then 3 tabs for 1 day, 2 tabs for 1 day, then 1 tab by mouth daily for 1 days, Disp: 21 tablet, Rfl: 0   Physical Exam:   Ht 5\' 4"  (1.626 m)   Wt 251 lb (113.9 kg)   BMI 43.08 kg/m   Salient findings:  CN II-XII intact  Bilateral EAC clear and TM intact with well  pneumatized middle ear spaces Weber 512: midline Rinne 512: AC > BC b/l  Rine 1024: AC > BC b/l  Anterior rhinoscopy: Septum relatively midilne; bilateral inferior turbinates without significant hypertrophy No lesions of oral cavity/oropharynx; dentition fair No obviously palpable neck masses/lymphadenopathy/thyromegaly No respiratory distress or stridor  Seprately Identifiable Procedures:  None  Impression & Plans:  Christina Phillips is a 72 y.o. female with: Bilateral SNHL Asymmetric SNHL - No other significant symptoms besides muffled hearing. Etiology unclear since she denies other significant symptoms - query if related to ototoxic chemotherapy but this was over 20 years ago in [place]. Unclear what to make of vertigo episode (BPPV?) but no symptoms at that time. MRI Brain and CT review was reassuring but unable to examine IAC well We discussed options and she would like to get an MRI IAC dedicated to further evaluate We will follow up with her afterwards, and discuss hearing aids  at next visit  - f/u 2 months    Thank you for allowing me the opportunity to care for your patient. Please do not hesitate to contact me should you have any other questions.  Sincerely, Jovita Kussmaul, MD Otolarynoglogist (ENT), [facility] Phone: [phone] Fax: [phone]  03/25/2023, 1:02 PM   MDM:  Level 4 Complexity/Problems addressed: undiagnosed problem but unclear prognosis requiring further testing Data complexity: mod - independent review of existing MRI and CT, prior notes, and ordering MRI - Morbidity: low

## 2023-03-25 NOTE — Patient Instructions (Signed)
I have ordered an imaging study for you to complete prior to your next visit. Please call [facility] Scheduling at [phone] to schedule your imaging if you have not received a call within 24 hours. If you are unable to complete your imaging study prior to your next scheduled visit please call our office to let us know.

## 2023-03-31 ENCOUNTER — Ambulatory Visit (HOSPITAL_BASED_OUTPATIENT_CLINIC_OR_DEPARTMENT_OTHER)
Admission: RE | Admit: 2023-03-31 | Discharge: 2023-03-31 | Disposition: A | Discharge status: Home / Self Care | Payer: Medicare HMO | Source: Ambulatory Visit | Attending: Otolaryngology | Admitting: Otolaryngology

## 2023-03-31 DIAGNOSIS — I6782 Cerebral ischemia: Secondary | ICD-10-CM | POA: Diagnosis not present

## 2023-03-31 DIAGNOSIS — H905 Unspecified sensorineural hearing loss: Secondary | ICD-10-CM | POA: Insufficient documentation

## 2023-03-31 DIAGNOSIS — H903 Sensorineural hearing loss, bilateral: Secondary | ICD-10-CM

## 2023-03-31 DIAGNOSIS — H9190 Unspecified hearing loss, unspecified ear: Secondary | ICD-10-CM | POA: Diagnosis not present

## 2023-03-31 MED ORDER — GADOBUTROL 1 MMOL/ML IV SOLN
10.0000 mL | Freq: Once | INTRAVENOUS | Status: AC | PRN
  Administered 2023-03-31: 10 mL via INTRAVENOUS
  Filled 2023-03-31: qty 10

## 2023-04-05 ENCOUNTER — Encounter: Payer: Self-pay | Admitting: Podiatry

## 2023-04-05 ENCOUNTER — Ambulatory Visit: Payer: Medicare HMO | Admitting: Podiatry

## 2023-04-05 DIAGNOSIS — B351 Tinea unguium: Secondary | ICD-10-CM

## 2023-04-05 DIAGNOSIS — M79675 Pain in left toe(s): Secondary | ICD-10-CM | POA: Diagnosis not present

## 2023-04-05 DIAGNOSIS — M79674 Pain in right toe(s): Secondary | ICD-10-CM | POA: Diagnosis not present

## 2023-05-14 ENCOUNTER — Other Ambulatory Visit: Payer: Self-pay | Admitting: Family Medicine

## 2023-05-14 DIAGNOSIS — I10 Essential (primary) hypertension: Secondary | ICD-10-CM

## 2023-05-19 ENCOUNTER — Encounter: Payer: Self-pay | Admitting: Obstetrics & Gynecology

## 2023-05-19 ENCOUNTER — Ambulatory Visit: Payer: Medicare HMO | Admitting: Obstetrics & Gynecology

## 2023-05-19 VITALS — BP 164/74 | HR 69 | Ht 66.0 in | Wt 252.0 lb

## 2023-05-19 DIAGNOSIS — Z01419 Encounter for gynecological examination (general) (routine) without abnormal findings: Secondary | ICD-10-CM | POA: Diagnosis not present

## 2023-05-19 DIAGNOSIS — Z1211 Encounter for screening for malignant neoplasm of colon: Secondary | ICD-10-CM

## 2023-05-19 DIAGNOSIS — Z1151 Encounter for screening for human papillomavirus (HPV): Secondary | ICD-10-CM | POA: Diagnosis not present

## 2023-05-19 NOTE — Progress Notes (Signed)
 Subjective:     Christina Phillips is a 73 y.o. female here for a routine exam.  No LMP recorded. Patient has had a hysterectomy. [id number] Birth Control Method:  PM hysterectomy Menstrual Calendar(currently): amenorrhea  Current complaints: none.   Current acute medical issues:  =   Recent Gynecologic History No LMP recorded. Patient has had a hysterectomy. Last Pap: years ago,  normal Last mammogram: 11/2018,  normal  Past Medical History:  Diagnosis Date   Cancer (HCC)    breast right, mastectomy after recurrence from lumpectomy/radiation   Hyperlipidemia     Past Surgical History:  Procedure Laterality Date   ABDOMINAL HYSTERECTOMY N/A    suspicious lesion   BREAST SURGERY N/A    Phreesia 02/29/2020   EYE SURGERY      OB History     Gravida  3   Para  2   Term  2   Preterm      AB  1   Living  2      SAB  1   IAB      Ectopic      Multiple      Live Births  2           Social History   Socioeconomic History   Marital status: Divorced    Spouse name: Not on file   Number of children: 2   Years of education: Not on file   Highest education level: Not on file  Occupational History   Not on file  Tobacco Use   Smoking status: Former   Smokeless tobacco: Never  Vaping Use   Vaping status: Never Used  Substance and Sexual Activity   Alcohol use: Yes    Comment: occas   Drug use: Never   Sexual activity: Not Currently    Birth control/protection: Surgical  Other Topics Concern   Not on file  Social History Narrative   2 children.   5 grandchildren.   Social Drivers of Corporate Investment Banker Strain: Low Risk  (05/21/2022)   Overall Financial Resource Strain (CARDIA)    Difficulty of Paying Living Expenses: Not very hard  Food Insecurity: No Food Insecurity (05/21/2022)   Hunger Vital Sign    Worried About Running Out of Food in the Last Year: Never true    Ran Out of Food in the Last Year: Never true  Transportation  Needs: No Transportation Needs (05/21/2022)   PRAPARE - Administrator, Civil Service (Medical): No    Lack of Transportation (Non-Medical): No  Physical Activity: Sufficiently Active (05/21/2022)   Exercise Vital Sign    Days of Exercise per Week: 5 days    Minutes of Exercise per Session: 30 min  Stress: No Stress Concern Present (05/21/2022)   [employer] of Occupational Health - Occupational Stress Questionnaire    Feeling of Stress : Not at all  Social Connections: Moderately Isolated (05/21/2022)   Social Connection and Isolation Panel [NHANES]    Frequency of Communication with Friends and Family: Three times a week    Frequency of Social Gatherings with Friends and Family: Three times a week    Attends Religious Services: 1 to 4 times per year    Active Member of Clubs or Organizations: No    Attends Banker Meetings: Never    Marital Status: Divorced    Family History  Problem Relation Age of Onset   Cancer Father    Hypertension Mother  Cancer Brother    Heart attack Sister      Current Outpatient Medications:    FLUoxetine  (PROZAC ) 20 MG capsule, Take 1 capsule (20 mg total) by mouth daily., Disp: 90 capsule, Rfl: 1   hydrochlorothiazide  (HYDRODIURIL ) 25 MG tablet, TAKE 1 TABLET (25 MG TOTAL) BY MOUTH DAILY., Disp: 90 tablet, Rfl: 0   losartan  (COZAAR ) 50 MG tablet, TAKE 1 TABLET BY MOUTH EVERY DAY, Disp: 90 tablet, Rfl: 1   meclizine  (ANTIVERT ) 25 MG tablet, Take 1 tablet (25 mg total) by mouth 3 (three) times daily as needed for dizziness., Disp: 30 tablet, Rfl: 0   Multiple Vitamins-Minerals (CENTRUM ADULTS PO), Take by mouth., Disp: , Rfl:    naproxen  (NAPROSYN ) 375 MG tablet, TAKE 1 TABLET BY MOUTH 2 TIMES DAILY., Disp: 20 tablet, Rfl: 3   ondansetron  (ZOFRAN -ODT) 8 MG disintegrating tablet, Take 1 tablet (8 mg total) by mouth every 8 (eight) hours as needed for nausea or vomiting., Disp: 12 tablet, Rfl: 0   pravastatin  (PRAVACHOL ) 20 MG  tablet, TAKE 1 TABLET BY MOUTH EVERY DAY, Disp: 90 tablet, Rfl: 0  Review of Systems  Review of Systems  Constitutional: Negative for fever, chills, weight loss, malaise/fatigue and diaphoresis.  HENT: Negative for hearing loss, ear pain, nosebleeds, congestion, sore throat, neck pain, tinnitus and ear discharge.   Eyes: Negative for blurred vision, double vision, photophobia, pain, discharge and redness.  Respiratory: Negative for cough, hemoptysis, sputum production, shortness of breath, wheezing and stridor.   Cardiovascular: Negative for chest pain, palpitations, orthopnea, claudication, leg swelling and PND.  Gastrointestinal: negative for abdominal pain. Negative for heartburn, nausea, vomiting, diarrhea, constipation, blood in stool and melena.  Genitourinary: Negative for dysuria, urgency, frequency, hematuria and flank pain.  Musculoskeletal: Negative for myalgias, back pain, joint pain and falls.  Skin: Negative for itching and rash.  Neurological: Negative for dizziness, tingling, tremors, sensory change, speech change, focal weakness, seizures, loss of consciousness, weakness and headaches.  Endo/Heme/Allergies: Negative for environmental allergies and polydipsia. Does not bruise/bleed easily.  Psychiatric/Behavioral: Negative for depression, suicidal ideas, hallucinations, memory loss and substance abuse. The patient is not nervous/anxious and does not have insomnia.        Objective:  Blood pressure (!) 164/74, pulse 69, height 5' 6 (1.676 m), weight 252 lb (114.3 kg).   Physical Exam  Vitals reviewed. Constitutional: She is oriented to person, place, and time. She appears well-developed and well-nourished.  HENT:  Head: Normocephalic and atraumatic.        Right Ear: External ear normal.  Left Ear: External ear normal.  Nose: Nose normal.  Mouth/Throat: Oropharynx is clear and moist.  Eyes: Conjunctivae and EOM are normal. Pupils are equal, round, and reactive to light.  Right eye exhibits no discharge. Left eye exhibits no discharge. No scleral icterus.  Neck: Normal range of motion. Neck supple. No tracheal deviation present. No thyromegaly present.  Cardiovascular: Normal rate, regular rhythm, normal heart sounds and intact distal pulses.  Exam reveals no gallop and no friction rub.   No murmur heard. Respiratory: Effort normal and breath sounds normal. No respiratory distress. She has no wheezes. She has no rales. She exhibits no tenderness.  GI: Soft. Bowel sounds are normal. She exhibits no distension and no mass. There is no tenderness. There is no rebound and no guarding.  Genitourinary:  Breasts no masses skin changes or nipple changes bilaterally      Vulva is normal without lesions Vagina is pink moist without discharge  Cervix normal in appearance and pap is done Uterus is normal size shape and contour Adnexa is negative with normal sized ovaries   Musculoskeletal: Normal range of motion. She exhibits no edema and no tenderness.  Neurological: She is alert and oriented to person, place, and time. She has normal reflexes. She displays normal reflexes. No cranial nerve deficit. She exhibits normal muscle tone. Coordination normal.  Skin: Skin is warm and dry. No rash noted. No erythema. No pallor.  Psychiatric: She has a normal mood and affect. Her behavior is normal. Judgment and thought content normal.       Medications Ordered at today's visit: No orders of the defined types were placed in this encounter.   Other orders placed at today's visit: No orders of the defined types were placed in this encounter.    ASSESSMENT + PLAN:    ICD-10-CM   1. Well woman exam with routine gynecological exam  Z01.419     2. Screening for colorectal cancer  Z12.11    Z12.12           Return in about 3 years (around 05/18/2026) for yearly.

## 2023-05-25 ENCOUNTER — Telehealth (INDEPENDENT_AMBULATORY_CARE_PROVIDER_SITE_OTHER): Payer: Self-pay | Admitting: Otolaryngology

## 2023-05-25 NOTE — Telephone Encounter (Signed)
 Spoke with patient and confirmed   Date: 05/26/2023 Status: Sch  Time: 10:15 AM [address] [place] [address] Confirmed

## 2023-05-26 ENCOUNTER — Encounter (INDEPENDENT_AMBULATORY_CARE_PROVIDER_SITE_OTHER): Payer: Self-pay

## 2023-05-26 ENCOUNTER — Ambulatory Visit (INDEPENDENT_AMBULATORY_CARE_PROVIDER_SITE_OTHER): Payer: Medicare HMO | Admitting: Otolaryngology

## 2023-05-26 VITALS — BP 165/72 | HR 85 | Wt 252.0 lb

## 2023-05-26 DIAGNOSIS — H903 Sensorineural hearing loss, bilateral: Secondary | ICD-10-CM

## 2023-05-26 DIAGNOSIS — R42 Dizziness and giddiness: Secondary | ICD-10-CM | POA: Diagnosis not present

## 2023-05-26 NOTE — Progress Notes (Signed)
 Dear Dr. Duanne, Here is my assessment for our mutual patient, Christina Phillips. Thank you for allowing me the opportunity to care for your patient. Please do not hesitate to contact me should you have any other questions. Sincerely, Dr. Eldora Blanch  Otolaryngology Clinic Note Referring provider: Dr. Duanne HPI:  Christina Phillips is a 73 y.o. female kindly referred by Dr. Duanne for evaluation of asymmetric hearing loss.  Initial visit (03/2023): Had it for a couple of years, but worse over last year or so. Patient reports that she has bilateral hearing loss, but reports right is worse. She had a cerumen impaction prior, but still fees like there is some muffled hearing, sometimes both sides (2-3 times per month - momentarily). She has some imbalance. She uses Meclizine  or Dramamine occassionally for it. Denies tinnitus. Query ototoxic med use (did have chemo for breast cancer 20 years ago). Has had episode of vertigo in March 2024 where she had elevated BP, no other symptoms (happened when she got out of bed) - dx with BPPV, no headaches, auras, light/sound sensitivity.   Patient denies: ear pain, drainage. Patient additionally denies: deep pain in ear canal, eustachian tube symptoms such as popping, crackling, sensitive to pressure changes Patient also denies barotrauma Prior ear surgery: No No signficant prior noise exposure ----------------------------------------------------- We decided to get an MRI for asymmetric HL and following up today  05/26/2023: She is following up after her MRI. Stable hearing; no issues with cerumen; muffling intermittent (maybe once a month but just momentary). She attributes some rare imbalance to her feet. No tinnitus, drainage. No episodes of vertigo. No headaches, no auras.  --------------------------------------------------------  H&N Surgery: no Personal or FHx of bleeding dz or anesthesia difficulty: no  AP/AC: denies  PMHx:  Breast Cancer (treated 20 years ago, did get chemo), HTN No heart attacks or stroke  Tobacco: remote history. Occupation: caregiver  Independent Review of Additional Tests or Records:  PCP Notes reviewed: has had right hearing loss, had a cerumen impaction earlier in 2024, removed.  Audiogram and Visit with Christina Phillips, AuD reviewed (01/27/2023): concerns for muffled hearing in the right ear. For months Christina Phillips has felt the right ear hearing is muffled. She had the wax removed from that ear. She still feels like it is full, and that words are unclear. She denies any pain or tinnitus in either ear. She knows she likely has loss in both ears, but its worse in the right. She is ready to try hearing aids since she is tired of asking people to repeat. Medical history shows no significant conditions with hearing loss risk.    Evaluation:  Otoscopy showed a clear view of the tympanic membranes, bilaterally Tympanometry results were consistent with normal middle ear function, bilaterally   Audiometric testing was completed using conventional audiometry with insert transducer. Speech Recognition Thresholds were  60dB in the right ear and 50dB in the left ear. Word Recognition was performed 40dB SL, scored 56% in the right ear and 96% in the left ear. Pure tone thresholds show asymmetric sensorineural hearing loss with the right ear worse. See audiogram below.  01/27/2023 Audiogram was independently reviewed and interpreted by me and it reveals Right ear: moderate downsloping to moderately severe SNHL; significantly worse than left ear; 56% word interpretation at 100dB; type A tympanogram Left ear: mild downsloping to moderately severe SNHL; 96% word interpretation at 90dB; type A tympanogram  SNHL= Sensorineural hearing loss    MRI Brain 2024: no obvious  large IAC masses (T2 below) CT Angio 2024: Mastoids well aerated b/l; cochlea with appropriate turn, no obvious pneumocochlea; no obvious large  encephalocele or enlarged aqueduct; study suboptimal given angio MRI IAC 03/31/2023 independently reviewed with attention to ears-- no retrocochlear lesions or mastoid effusion  PMH/Meds/All/SocHx/FamHx/ROS:   Past Medical History:  Diagnosis Date   Cancer (HCC)    breast right, mastectomy after recurrence from lumpectomy/radiation   Hyperlipidemia      Past Surgical History:  Procedure Laterality Date   ABDOMINAL HYSTERECTOMY N/A    suspicious lesion   BREAST SURGERY N/A    Phreesia 02/29/2020   EYE SURGERY      Family History  Problem Relation Age of Onset   Cancer Father    Hypertension Mother    Cancer Brother    Heart attack Sister      Social Connections: Moderately Isolated (05/21/2022)   Social Connection and Isolation Panel [NHANES]    Frequency of Communication with Friends and Family: Three times a week    Frequency of Social Gatherings with Friends and Family: Three times a week    Attends Religious Services: 1 to 4 times per year    Active Member of Clubs or Organizations: No    Attends Banker Meetings: Never    Marital Status: Divorced      Current Outpatient Medications:    FLUoxetine  (PROZAC ) 20 MG capsule, Take 1 capsule (20 mg total) by mouth daily., Disp: 90 capsule, Rfl: 1   hydrochlorothiazide  (HYDRODIURIL ) 25 MG tablet, TAKE 1 TABLET (25 MG TOTAL) BY MOUTH DAILY., Disp: 90 tablet, Rfl: 0   losartan  (COZAAR ) 50 MG tablet, TAKE 1 TABLET BY MOUTH EVERY DAY, Disp: 90 tablet, Rfl: 1   meclizine  (ANTIVERT ) 25 MG tablet, Take 1 tablet (25 mg total) by mouth 3 (three) times daily as needed for dizziness., Disp: 30 tablet, Rfl: 0   Multiple Vitamins-Minerals (CENTRUM ADULTS PO), Take by mouth., Disp: , Rfl:    naproxen  (NAPROSYN ) 375 MG tablet, TAKE 1 TABLET BY MOUTH 2 TIMES DAILY., Disp: 20 tablet, Rfl: 3   ondansetron  (ZOFRAN -ODT) 8 MG disintegrating tablet, Take 1 tablet (8 mg total) by mouth every 8 (eight) hours as needed for nausea or  vomiting., Disp: 12 tablet, Rfl: 0   pravastatin  (PRAVACHOL ) 20 MG tablet, TAKE 1 TABLET BY MOUTH EVERY DAY, Disp: 90 tablet, Rfl: 0   Physical Exam:   BP (!) 165/72 (BP Location: Right Arm, Patient Position: Sitting, Cuff Size: Normal) Comment: takes bp medication  Pulse 85   Wt 252 lb (114.3 kg)   SpO2 94%   BMI 40.67 kg/m   Salient findings:  CN II-XII intact  Bilateral EAC clear and TM intact with well pneumatized middle ear spaces; no cerumen Weber 512: midline Rinne 512: AC > BC b/l  Rine 1024: AC > BC b/l  Anterior rhinoscopy: Septum relatively midilne; bilateral inferior turbinates without significant hypertrophy No lesions of oral cavity/oropharynx; dentition fair No obviously palpable neck masses/lymphadenopathy/thyromegaly No respiratory distress or stridor  Seprately Identifiable Procedures:  None  Impression & Plans:  Christina Phillips is a 73 y.o. female with: Bilateral SNHL Asymmetric SNHL Vertigo - No other significant symptoms besides muffled hearing which is very rare and lasts for a few seconds. Unclear etiology for asymmetric SNHL but MRI IAC without retrocochlear lesions. No vertigo in over a year  We discussed HA and she is having some problems with the right ear, and I think she could benefit from  amplification. Cleared medically for it. She will contact her insurance company to confirm where she is approved for it.  - f/u 1 year with audio    Thank you for allowing me the opportunity to care for your patient. Please do not hesitate to contact me should you have any other questions.  Sincerely, Eldora Blanch, MD Otolarynoglogist (ENT), [facility] Phone: [phone] Fax: [phone]  05/26/2023, 10:51 AM   MDM:  Level 3 - [address]14 Complexity/Problems addressed: mod - two chronic medical problems Data complexity: mod - independent review of imaging (MRI) - Morbidity: low

## 2023-06-03 ENCOUNTER — Other Ambulatory Visit: Payer: Self-pay | Admitting: Family Medicine

## 2023-06-03 DIAGNOSIS — E782 Mixed hyperlipidemia: Secondary | ICD-10-CM

## 2023-06-04 ENCOUNTER — Other Ambulatory Visit: Payer: Self-pay | Admitting: Family Medicine

## 2023-06-06 NOTE — Telephone Encounter (Signed)
Requested medication (s) are due for refill today:   Not sure  03/07/2023 #90, 0 refills;   Also rx for 05/17/2023 #90, 1 refill   Requested medication (s) are on the active medication list:   Yes  Future visit scheduled:   No.  LOV 01/25/2023   Last CPE 09/20/2022   Last ordered: 03/07/2023 #90, 0 refills   Unable to refill because labs are due.     Requested Prescriptions  Pending Prescriptions Disp Refills   hydrochlorothiazide (HYDRODIURIL) 25 MG tablet [Pharmacy Med Name: HYDROCHLOROTHIAZIDE 25 MG TAB] 90 tablet 0    Sig: TAKE 1 TABLET (25 MG TOTAL) BY MOUTH DAILY.     Cardiovascular: Diuretics - Thiazide Failed - 06/06/2023 10:11 AM      Failed - Cr in normal range and within 180 days    Creat  Date Value Ref Range Status  12/04/2020 0.82 0.50 - 1.05 mg/dL Final   Creatinine, Ser  Date Value Ref Range Status  08/01/2022 0.80 0.44 - 1.00 mg/dL Final         Failed - K in normal range and within 180 days    Potassium  Date Value Ref Range Status  08/01/2022 3.9 3.5 - 5.1 mmol/L Final         Failed - Na in normal range and within 180 days    Sodium  Date Value Ref Range Status  08/01/2022 142 135 - 145 mmol/L Final         Failed - Last BP in normal range    BP Readings from Last 1 Encounters:  05/26/23 (!) 165/72         Failed - Valid encounter within last 6 months    Recent Outpatient Visits           2 years ago Malodorous urine   [facility] Donita Brooks, MD   2 years ago Neuropathy   [facility] Donita Brooks, MD   2 years ago Osteoporosis, unspecified osteoporosis type, unspecified pathological fracture presence   [facility] Pickard, Priscille Heidelberg, MD   3 years ago Encounter for screening mammogram for malignant neoplasm of breast   [facility] Pickard, Priscille Heidelberg, MD

## 2023-06-17 ENCOUNTER — Telehealth (INDEPENDENT_AMBULATORY_CARE_PROVIDER_SITE_OTHER): Payer: Self-pay | Admitting: Otolaryngology

## 2023-06-17 NOTE — Telephone Encounter (Signed)
I have forwarded medical clearance paperwork faxed by [facility] to Dr. Allena Katz for completion.  I called [facility] and left a voicemail message stating they will have to submit a request to [facility] to receive the patient's Audiological Evaluation.

## 2023-06-21 ENCOUNTER — Telehealth: Payer: Self-pay

## 2023-06-21 NOTE — Telephone Encounter (Signed)
Faxed signed authorization for release of records to [facility].  Received fax confirmation

## 2023-06-23 ENCOUNTER — Other Ambulatory Visit: Payer: Self-pay | Admitting: Family Medicine

## 2023-06-23 DIAGNOSIS — E782 Mixed hyperlipidemia: Secondary | ICD-10-CM

## 2023-06-23 DIAGNOSIS — M1711 Unilateral primary osteoarthritis, right knee: Secondary | ICD-10-CM

## 2023-06-23 NOTE — Telephone Encounter (Signed)
 Requested by interface surescripts. Last OV 01/25/23 last labs 09/20/22.  Requested Prescriptions  Pending Prescriptions Disp Refills   pravastatin  (PRAVACHOL ) 20 MG tablet [Pharmacy Med Name: PRAVASTATIN  SODIUM 20 MG TAB] 90 tablet 0    Sig: TAKE 1 TABLET BY MOUTH EVERY DAY     Cardiovascular:  Antilipid - Statins Failed - 06/23/2023  2:48 PM      Failed - Valid encounter within last 12 months    Recent Outpatient Visits           2 years ago Malodorous urine   [facility] Pickard, Butler DASEN, MD   2 years ago Neuropathy   [facility] Duanne Butler DASEN, MD   2 years ago Osteoporosis, unspecified osteoporosis type, unspecified pathological fracture presence   [facility] Pickard, Butler DASEN, MD   3 years ago Encounter for screening mammogram for malignant neoplasm of breast   [facility] Pickard, Butler DASEN, MD              Failed - Lipid Panel in normal range within the last 12 months    Cholesterol  Date Value Ref Range Status  09/20/2022 208 (H) <200 mg/dL Final   LDL Cholesterol (Calc)  Date Value Ref Range Status  09/20/2022 127 (H) mg/dL (calc) Final    Comment:    Reference range: <100 . Desirable range <100 mg/dL for primary prevention;   <70 mg/dL for patients with CHD or diabetic patients  with > or = 2 CHD risk factors. SABRA LDL-C is now calculated using the Martin-Hopkins  calculation, which is a validated novel method providing  better accuracy than the Friedewald equation in the  estimation of LDL-C.  Gladis APPLETHWAITE et al. SANDREA. 7986;689(80): 2061-2068  (http://education.[facility].com/faq/FAQ164)    HDL  Date Value Ref Range Status  09/20/2022 50 > OR = 50 mg/dL Final   Triglycerides  Date Value Ref Range Status  09/20/2022 194 (H) <150 mg/dL Final         Passed - Patient is not pregnant      Refused Prescriptions Disp Refills   pravastatin  (PRAVACHOL ) 10 MG tablet [Pharmacy Med Name:  PRAVASTATIN  SODIUM 10 MG TAB] 90 tablet 0    Sig: TAKE 1 TABLET BY MOUTH EVERY DAY     Cardiovascular:  Antilipid - Statins Failed - 06/23/2023  2:48 PM      Failed - Valid encounter within last 12 months    Recent Outpatient Visits           2 years ago Malodorous urine   [facility] Pickard, Butler DASEN, MD   2 years ago Neuropathy   [facility] Duanne Butler DASEN, MD   2 years ago Osteoporosis, unspecified osteoporosis type, unspecified pathological fracture presence   [facility] Pickard, Butler DASEN, MD   3 years ago Encounter for screening mammogram for malignant neoplasm of breast   [facility] Pickard, Butler DASEN, MD              Failed - Lipid Panel in normal range within the last 12 months    Cholesterol  Date Value Ref Range Status  09/20/2022 208 (H) <200 mg/dL Final   LDL Cholesterol (Calc)  Date Value Ref Range Status  09/20/2022 127 (H) mg/dL (calc) Final    Comment:    Reference range: <100 . Desirable range <100 mg/dL for primary prevention;   <70 mg/dL for  patients with CHD or diabetic patients  with > or = 2 CHD risk factors. SABRA LDL-C is now calculated using the Martin-Hopkins  calculation, which is a validated novel method providing  better accuracy than the Friedewald equation in the  estimation of LDL-C.  Gladis APPLETHWAITE et al. SANDREA. 7986;689(80): 2061-2068  (http://education.[facility].com/faq/FAQ164)    HDL  Date Value Ref Range Status  09/20/2022 50 > OR = 50 mg/dL Final   Triglycerides  Date Value Ref Range Status  09/20/2022 194 (H) <150 mg/dL Final         Passed - Patient is not pregnant

## 2023-06-23 NOTE — Telephone Encounter (Signed)
 Requested by interface surescripts. Medication discontinued 09/29/22 Requested Prescriptions  Pending Prescriptions Disp Refills   pravastatin  (PRAVACHOL ) 20 MG tablet [Pharmacy Med Name: PRAVASTATIN  SODIUM 20 MG TAB] 90 tablet 0    Sig: TAKE 1 TABLET BY MOUTH EVERY DAY     Cardiovascular:  Antilipid - Statins Failed - 06/23/2023  2:46 PM      Failed - Valid encounter within last 12 months    Recent Outpatient Visits           2 years ago Malodorous urine   [facility] Pickard, Butler DASEN, MD   2 years ago Neuropathy   [facility] Duanne Butler DASEN, MD   2 years ago Osteoporosis, unspecified osteoporosis type, unspecified pathological fracture presence   [facility] Pickard, Butler DASEN, MD   3 years ago Encounter for screening mammogram for malignant neoplasm of breast   [facility] Pickard, Butler DASEN, MD              Failed - Lipid Panel in normal range within the last 12 months    Cholesterol  Date Value Ref Range Status  09/20/2022 208 (H) <200 mg/dL Final   LDL Cholesterol (Calc)  Date Value Ref Range Status  09/20/2022 127 (H) mg/dL (calc) Final    Comment:    Reference range: <100 . Desirable range <100 mg/dL for primary prevention;   <70 mg/dL for patients with CHD or diabetic patients  with > or = 2 CHD risk factors. SABRA LDL-C is now calculated using the Martin-Hopkins  calculation, which is a validated novel method providing  better accuracy than the Friedewald equation in the  estimation of LDL-C.  Gladis APPLETHWAITE et al. SANDREA. 7986;689(80): 2061-2068  (http://education.[facility].com/faq/FAQ164)    HDL  Date Value Ref Range Status  09/20/2022 50 > OR = 50 mg/dL Final   Triglycerides  Date Value Ref Range Status  09/20/2022 194 (H) <150 mg/dL Final         Passed - Patient is not pregnant      Refused Prescriptions Disp Refills   pravastatin  (PRAVACHOL ) 10 MG tablet [Pharmacy Med Name:  PRAVASTATIN  SODIUM 10 MG TAB] 90 tablet 0    Sig: TAKE 1 TABLET BY MOUTH EVERY DAY     Cardiovascular:  Antilipid - Statins Failed - 06/23/2023  2:46 PM      Failed - Valid encounter within last 12 months    Recent Outpatient Visits           2 years ago Malodorous urine   [facility] Pickard, Butler DASEN, MD   2 years ago Neuropathy   [facility] Duanne Butler DASEN, MD   2 years ago Osteoporosis, unspecified osteoporosis type, unspecified pathological fracture presence   [facility] Pickard, Butler DASEN, MD   3 years ago Encounter for screening mammogram for malignant neoplasm of breast   [facility] Pickard, Butler DASEN, MD              Failed - Lipid Panel in normal range within the last 12 months    Cholesterol  Date Value Ref Range Status  09/20/2022 208 (H) <200 mg/dL Final   LDL Cholesterol (Calc)  Date Value Ref Range Status  09/20/2022 127 (H) mg/dL (calc) Final    Comment:    Reference range: <100 . Desirable range <100 mg/dL for primary prevention;   <70 mg/dL for patients with CHD or  diabetic patients  with > or = 2 CHD risk factors. SABRA LDL-C is now calculated using the Martin-Hopkins  calculation, which is a validated novel method providing  better accuracy than the Friedewald equation in the  estimation of LDL-C.  Gladis APPLETHWAITE et al. SANDREA. 7986;689(80): 2061-2068  (http://education.[facility].com/faq/FAQ164)    HDL  Date Value Ref Range Status  09/20/2022 50 > OR = 50 mg/dL Final   Triglycerides  Date Value Ref Range Status  09/20/2022 194 (H) <150 mg/dL Final         Passed - Patient is not pregnant

## 2023-06-28 ENCOUNTER — Telehealth (INDEPENDENT_AMBULATORY_CARE_PROVIDER_SITE_OTHER): Payer: Self-pay | Admitting: Otolaryngology

## 2023-06-28 NOTE — Telephone Encounter (Signed)
Spoke w/  Kirt Boys at [employer].  She confirmed they received Medical Clearance from Dr. Allena Katz on 06/23/2023.

## 2023-07-05 ENCOUNTER — Ambulatory Visit: Payer: Medicare HMO | Admitting: Podiatry

## 2023-07-05 ENCOUNTER — Encounter: Payer: Self-pay | Admitting: Podiatry

## 2023-07-05 VITALS — Ht 66.0 in | Wt 252.0 lb

## 2023-07-05 DIAGNOSIS — M79674 Pain in right toe(s): Secondary | ICD-10-CM | POA: Diagnosis not present

## 2023-07-05 DIAGNOSIS — M79675 Pain in left toe(s): Secondary | ICD-10-CM

## 2023-07-05 DIAGNOSIS — B351 Tinea unguium: Secondary | ICD-10-CM | POA: Diagnosis not present

## 2023-07-05 NOTE — Progress Notes (Signed)
  Subjective:  Patient ID: Christina Phillips, female    DOB: October 25, 1950,  MRN: [id number]  73 y.o. female presents painful, elongated thickened toenails x 10 which are symptomatic when wearing enclosed shoe gear. This interferes with his/her daily activities.  Chief Complaint  Patient presents with   RFC    She is here for a nail trim, PCP is Dr. Tanya Nones, last seen in the fall.    New problem(s): None   PCP is Donita Brooks, MD.  No Known Allergies  Review of Systems: Negative except as noted in the HPI.   Objective:  Christina Phillips is a pleasant 73 y.o. female in NAD. AAO x 3.  Vascular Examination: Vascular status intact b/l with palpable pedal pulses. CFT immediate b/l. Pedal hair present. No edema. No pain with calf compression b/l. Skin temperature gradient WNL b/l. No varicosities noted. No cyanosis or clubbing noted.  Neurological Examination: Sensation grossly intact b/l with 10 gram monofilament. Vibratory sensation intact b/l.  Dermatological Examination: Pedal skin with normal turgor, texture and tone b/l. No open wounds nor interdigital macerations noted. Toenails 1-5 b/l thick, discolored, elongated with subungual debris and pain on dorsal palpation.   Resolved hyperkeratotic and porokeratotic lesions b/l.  Musculoskeletal Examination: Muscle strength 5/5 to b/l LE.  No pain, crepitus noted b/l. No gross pedal deformities. Patient ambulates independently without assistive aids.   Radiographs: None  Last A1c:       No data to display           Assessment:   1. Pain due to onychomycosis of toenails of both feet    Plan:  Patient was evaluated and treated. All patient's and/or POA's questions/concerns addressed on today's visit. Toenails 1-5 debrided in length and girth without incident. Continue soft, supportive shoe gear daily. Report any pedal injuries to medical professional. Call office if there are any  questions/concerns. -Patient/POA to call should there be question/concern in the interim.  Return in about 3 months (around 10/02/2023).  Freddie Breech, DPM      [place] LOCATION: [address], [place] [address]                   Office [phone]   [facility] LOCATION: [address] [place], [place] [address] Office [phone]

## 2023-07-11 ENCOUNTER — Ambulatory Visit (INDEPENDENT_AMBULATORY_CARE_PROVIDER_SITE_OTHER): Payer: Medicare HMO

## 2023-07-11 ENCOUNTER — Encounter: Payer: Medicare HMO | Admitting: Family Medicine

## 2023-07-11 DIAGNOSIS — Z23 Encounter for immunization: Secondary | ICD-10-CM | POA: Diagnosis not present

## 2023-08-14 ENCOUNTER — Other Ambulatory Visit: Payer: Self-pay | Admitting: Family Medicine

## 2023-08-16 NOTE — Telephone Encounter (Signed)
 Requested medication (s) are due for refill today- yes  Requested medication (s) are on the active medication list -yes  Future visit scheduled -no  Last refill: 02/22/23 #20 3RF  Notes to clinic: fails lab protocol- over 1 year-08/01/22  Requested Prescriptions  Pending Prescriptions Disp Refills   naproxen (NAPROSYN) 375 MG tablet [Pharmacy Med Name: NAPROXEN 375 MG TABLET] 20 tablet 3    Sig: TAKE 1 TABLET BY MOUTH TWICE A DAY     Analgesics:  NSAIDS Failed - 08/16/2023  3:59 PM      Failed - Manual Review: Labs are only required if the patient has taken medication for more than 8 weeks.      Failed - Cr in normal range and within 360 days    Creat  Date Value Ref Range Status  12/04/2020 0.82 0.50 - 1.05 mg/dL Final   Creatinine, Ser  Date Value Ref Range Status  08/01/2022 0.80 0.44 - 1.00 mg/dL Final         Failed - HGB in normal range and within 360 days    Hemoglobin  Date Value Ref Range Status  08/01/2022 13.9 12.0 - 15.0 g/dL Final         Failed - PLT in normal range and within 360 days    Platelets  Date Value Ref Range Status  08/01/2022 247 150 - 400 K/uL Final         Failed - HCT in normal range and within 360 days    HCT  Date Value Ref Range Status  08/01/2022 41.0 36.0 - 46.0 % Final         Failed - eGFR is 30 or above and within 360 days    GFR, Est African American  Date Value Ref Range Status  03/03/2020 82 > OR = 60 mL/min/1.25m2 Final   GFR, Est Non African American  Date Value Ref Range Status  03/03/2020 71 > OR = 60 mL/min/1.63m2 Final   GFR, Estimated  Date Value Ref Range Status  08/01/2022 >60 >60 mL/min Final    Comment:    (NOTE) Calculated using the CKD-EPI Creatinine Equation (2021)    eGFR  Date Value Ref Range Status  12/04/2020 78 > OR = 60 mL/min/1.89m2 Final    Comment:    The eGFR is based on the CKD-EPI 2021 equation. To calculate  the new eGFR from a previous Creatinine or Cystatin C result, go to  https://www.kidney.org/professionals/ kdoqi/gfr%5Fcalculator          Passed - Patient is not pregnant      Passed - Valid encounter within last 12 months    Recent Outpatient Visits           6 months ago Impacted cerumen of right ear   [place] Donita Brooks, MD   7 months ago Bilateral hearing loss, unspecified hearing loss type   [facility] Donita Brooks, MD   11 months ago Screening cholesterol level   [facility] Pickard, Priscille Heidelberg, MD   1 year ago Benign essential HTN   [place] [facility] Tanya Nones, Priscille Heidelberg, MD   1 year ago Sebaceous cyst of labia   [place] [facility] Donita Brooks, MD                 Requested Prescriptions  Pending Prescriptions Disp Refills   naproxen (NAPROSYN) 375 MG  tablet [Pharmacy Med Name: NAPROXEN 375 MG TABLET] 20 tablet 3    Sig: TAKE 1 TABLET BY MOUTH TWICE A DAY     Analgesics:  NSAIDS Failed - 08/16/2023  3:59 PM      Failed - Manual Review: Labs are only required if the patient has taken medication for more than 8 weeks.      Failed - Cr in normal range and within 360 days    Creat  Date Value Ref Range Status  12/04/2020 0.82 0.50 - 1.05 mg/dL Final   Creatinine, Ser  Date Value Ref Range Status  08/01/2022 0.80 0.44 - 1.00 mg/dL Final         Failed - HGB in normal range and within 360 days    Hemoglobin  Date Value Ref Range Status  08/01/2022 13.9 12.0 - 15.0 g/dL Final         Failed - PLT in normal range and within 360 days    Platelets  Date Value Ref Range Status  08/01/2022 247 150 - 400 K/uL Final         Failed - HCT in normal range and within 360 days    HCT  Date Value Ref Range Status  08/01/2022 41.0 36.0 - 46.0 % Final         Failed - eGFR is 30 or above and within 360 days    GFR, Est African American  Date Value Ref Range Status  03/03/2020 82 > OR  = 60 mL/min/1.50m2 Final   GFR, Est Non African American  Date Value Ref Range Status  03/03/2020 71 > OR = 60 mL/min/1.92m2 Final   GFR, Estimated  Date Value Ref Range Status  08/01/2022 >60 >60 mL/min Final    Comment:    (NOTE) Calculated using the CKD-EPI Creatinine Equation (2021)    eGFR  Date Value Ref Range Status  12/04/2020 78 > OR = 60 mL/min/1.60m2 Final    Comment:    The eGFR is based on the CKD-EPI 2021 equation. To calculate  the new eGFR from a previous Creatinine or Cystatin C result, go to https://www.kidney.org/professionals/ kdoqi/gfr%5Fcalculator          Passed - Patient is not pregnant      Passed - Valid encounter within last 12 months    Recent Outpatient Visits           6 months ago Impacted cerumen of right ear   [facility] Donita Brooks, MD   7 months ago Bilateral hearing loss, unspecified hearing loss type   [place] [facility] Donita Brooks, MD   11 months ago Screening cholesterol level   [facility] Pickard, Priscille Heidelberg, MD   1 year ago Benign essential HTN   [place] [facility] Pickard, Priscille Heidelberg, MD   1 year ago Sebaceous cyst of labia   [place] [facility] Pickard, Priscille Heidelberg, MD

## 2023-09-01 ENCOUNTER — Ambulatory Visit: Payer: Medicare HMO

## 2023-09-15 ENCOUNTER — Other Ambulatory Visit: Payer: Medicare HMO

## 2023-09-15 DIAGNOSIS — I10 Essential (primary) hypertension: Secondary | ICD-10-CM | POA: Diagnosis not present

## 2023-09-15 DIAGNOSIS — E782 Mixed hyperlipidemia: Secondary | ICD-10-CM

## 2023-09-16 LAB — CBC WITH DIFFERENTIAL/PLATELET
Absolute Lymphocytes: 1510 {cells}/uL (ref 850–3900)
Absolute Monocytes: 639 {cells}/uL (ref 200–950)
Basophils Absolute: 61 {cells}/uL (ref 0–200)
Basophils Relative: 0.9 %
Eosinophils Absolute: 150 {cells}/uL (ref 15–500)
Eosinophils Relative: 2.2 %
HCT: 43 % (ref 35.0–45.0)
Hemoglobin: 13.2 g/dL (ref 11.7–15.5)
MCH: 26.3 pg — ABNORMAL LOW (ref 27.0–33.0)
MCHC: 30.7 g/dL — ABNORMAL LOW (ref 32.0–36.0)
MCV: 85.8 fL (ref 80.0–100.0)
MPV: 10 fL (ref 7.5–12.5)
Monocytes Relative: 9.4 %
Neutro Abs: 4440 {cells}/uL (ref 1500–7800)
Neutrophils Relative %: 65.3 %
Platelets: 314 10*3/uL (ref 140–400)
RBC: 5.01 10*6/uL (ref 3.80–5.10)
RDW: 14.5 % (ref 11.0–15.0)
Total Lymphocyte: 22.2 %
WBC: 6.8 10*3/uL (ref 3.8–10.8)

## 2023-09-16 LAB — COMPLETE METABOLIC PANEL WITHOUT GFR
AG Ratio: 2 (calc) (ref 1.0–2.5)
ALT: 18 U/L (ref 6–29)
AST: 19 U/L (ref 10–35)
Albumin: 4.3 g/dL (ref 3.6–5.1)
Alkaline phosphatase (APISO): 55 U/L (ref 37–153)
BUN/Creatinine Ratio: 19 (calc) (ref 6–22)
BUN: 20 mg/dL (ref 7–25)
CO2: 25 mmol/L (ref 20–32)
Calcium: 9.8 mg/dL (ref 8.6–10.4)
Chloride: 104 mmol/L (ref 98–110)
Creat: 1.04 mg/dL — ABNORMAL HIGH (ref 0.60–1.00)
Globulin: 2.2 g/dL (ref 1.9–3.7)
Glucose, Bld: 80 mg/dL (ref 65–99)
Potassium: 4.9 mmol/L (ref 3.5–5.3)
Sodium: 139 mmol/L (ref 135–146)
Total Bilirubin: 0.4 mg/dL (ref 0.2–1.2)
Total Protein: 6.5 g/dL (ref 6.1–8.1)

## 2023-09-16 LAB — LIPID PANEL
Cholesterol: 178 mg/dL (ref <200)
HDL: 45 mg/dL — ABNORMAL LOW (ref >=50)
LDL Cholesterol (Calc): 105 mg/dL — ABNORMAL HIGH
Non-HDL Cholesterol (Calc): 133 mg/dL — ABNORMAL HIGH (ref <130)
Total CHOL/HDL Ratio: 4 (calc) (ref <5.0)
Triglycerides: 167 mg/dL — ABNORMAL HIGH (ref <150)

## 2023-09-18 ENCOUNTER — Other Ambulatory Visit: Payer: Self-pay

## 2023-09-18 ENCOUNTER — Emergency Department (HOSPITAL_COMMUNITY)
Admission: EM | Admit: 2023-09-18 | Discharge: 2023-09-18 | Disposition: A | Discharge status: Home / Self Care | Attending: Emergency Medicine | Admitting: Emergency Medicine

## 2023-09-18 ENCOUNTER — Emergency Department (HOSPITAL_COMMUNITY)

## 2023-09-18 ENCOUNTER — Encounter (HOSPITAL_COMMUNITY): Payer: Self-pay

## 2023-09-18 DIAGNOSIS — S12041A Nondisplaced lateral mass fracture of first cervical vertebra, initial encounter for closed fracture: Secondary | ICD-10-CM | POA: Insufficient documentation

## 2023-09-18 DIAGNOSIS — S8011XA Contusion of right lower leg, initial encounter: Secondary | ICD-10-CM | POA: Insufficient documentation

## 2023-09-18 DIAGNOSIS — M79662 Pain in left lower leg: Secondary | ICD-10-CM | POA: Diagnosis not present

## 2023-09-18 DIAGNOSIS — I6523 Occlusion and stenosis of bilateral carotid arteries: Secondary | ICD-10-CM | POA: Diagnosis not present

## 2023-09-18 DIAGNOSIS — M1711 Unilateral primary osteoarthritis, right knee: Secondary | ICD-10-CM | POA: Diagnosis not present

## 2023-09-18 DIAGNOSIS — S199XXA Unspecified injury of neck, initial encounter: Secondary | ICD-10-CM | POA: Diagnosis present

## 2023-09-18 DIAGNOSIS — M546 Pain in thoracic spine: Secondary | ICD-10-CM | POA: Diagnosis not present

## 2023-09-18 DIAGNOSIS — S80812A Abrasion, left lower leg, initial encounter: Secondary | ICD-10-CM | POA: Diagnosis not present

## 2023-09-18 DIAGNOSIS — I10 Essential (primary) hypertension: Secondary | ICD-10-CM | POA: Insufficient documentation

## 2023-09-18 DIAGNOSIS — I6529 Occlusion and stenosis of unspecified carotid artery: Secondary | ICD-10-CM | POA: Diagnosis not present

## 2023-09-18 DIAGNOSIS — M7731 Calcaneal spur, right foot: Secondary | ICD-10-CM | POA: Diagnosis not present

## 2023-09-18 DIAGNOSIS — M542 Cervicalgia: Secondary | ICD-10-CM | POA: Diagnosis not present

## 2023-09-18 DIAGNOSIS — M47812 Spondylosis without myelopathy or radiculopathy, cervical region: Secondary | ICD-10-CM | POA: Diagnosis not present

## 2023-09-18 DIAGNOSIS — Z041 Encounter for examination and observation following transport accident: Secondary | ICD-10-CM | POA: Diagnosis not present

## 2023-09-18 DIAGNOSIS — S0990XA Unspecified injury of head, initial encounter: Secondary | ICD-10-CM | POA: Insufficient documentation

## 2023-09-18 DIAGNOSIS — Y92410 Unspecified street and highway as the place of occurrence of the external cause: Secondary | ICD-10-CM | POA: Diagnosis not present

## 2023-09-18 DIAGNOSIS — Z79899 Other long term (current) drug therapy: Secondary | ICD-10-CM | POA: Insufficient documentation

## 2023-09-18 DIAGNOSIS — M1712 Unilateral primary osteoarthritis, left knee: Secondary | ICD-10-CM | POA: Diagnosis not present

## 2023-09-18 DIAGNOSIS — Z23 Encounter for immunization: Secondary | ICD-10-CM | POA: Insufficient documentation

## 2023-09-18 DIAGNOSIS — S8991XA Unspecified injury of right lower leg, initial encounter: Secondary | ICD-10-CM | POA: Diagnosis not present

## 2023-09-18 DIAGNOSIS — S12000A Unspecified displaced fracture of first cervical vertebra, initial encounter for closed fracture: Secondary | ICD-10-CM | POA: Diagnosis not present

## 2023-09-18 MED ORDER — LOSARTAN POTASSIUM 50 MG PO TABS
50.0000 mg | ORAL_TABLET | Freq: Once | ORAL | Status: AC
  Administered 2023-09-18: 50 mg via ORAL
  Filled 2023-09-18: qty 1

## 2023-09-18 MED ORDER — OXYCODONE-ACETAMINOPHEN 5-325 MG PO TABS
1.0000 | ORAL_TABLET | Freq: Once | ORAL | Status: AC
  Administered 2023-09-18: 1 via ORAL
  Filled 2023-09-18: qty 1

## 2023-09-18 MED ORDER — OXYCODONE-ACETAMINOPHEN 5-325 MG PO TABS: 1.0000 | ORAL_TABLET | Freq: Four times a day (QID) | ORAL | 0 refills | Status: DC | PRN

## 2023-09-18 MED ORDER — TETANUS-DIPHTH-ACELL PERTUSSIS 5-2.5-18.5 LF-MCG/0.5 IM SUSY
0.5000 mL | PREFILLED_SYRINGE | Freq: Once | INTRAMUSCULAR | Status: AC
  Administered 2023-09-18: 0.5 mL via INTRAMUSCULAR
  Filled 2023-09-18: qty 0.5
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
## 2023-09-18 NOTE — Discharge Instructions (Addendum)
 As we discussed, you have a C1 fracture that neurosurgery recommend that you stay in a hard collar.  Please call Dr. Ruthann Cover office tomorrow for appointment in the week  I have prescribed Percocet as needed for pain  You have high blood pressure and recommend continue taking your Cozaar  and recheck your blood pressure with your doctor in a week  Return to ER if you have severe headache or numbness or weakness or trouble walking

## 2023-09-22 ENCOUNTER — Encounter: Payer: Medicare HMO | Admitting: Family Medicine

## 2023-09-27 ENCOUNTER — Ambulatory Visit: Admitting: Family Medicine

## 2023-09-27 ENCOUNTER — Encounter: Payer: Self-pay | Admitting: Family Medicine

## 2023-09-27 VITALS — BP 136/84 | HR 74 | Temp 98.6°F | Ht 66.0 in | Wt 256.0 lb

## 2023-09-27 DIAGNOSIS — I10 Essential (primary) hypertension: Secondary | ICD-10-CM | POA: Diagnosis not present

## 2023-09-27 DIAGNOSIS — Z0001 Encounter for general adult medical examination with abnormal findings: Secondary | ICD-10-CM | POA: Diagnosis not present

## 2023-09-27 DIAGNOSIS — Z0000 Encounter for general adult medical examination without abnormal findings: Secondary | ICD-10-CM

## 2023-09-27 DIAGNOSIS — S12091D Other nondisplaced fracture of first cervical vertebra, subsequent encounter for fracture with routine healing: Secondary | ICD-10-CM | POA: Diagnosis not present

## 2023-09-27 DIAGNOSIS — E782 Mixed hyperlipidemia: Secondary | ICD-10-CM | POA: Diagnosis not present

## 2023-09-27 MED ORDER — CEPHALEXIN 500 MG PO CAPS: 500.0000 mg | ORAL_CAPSULE | Freq: Three times a day (TID) | ORAL | 0 refills | Status: AC

## 2023-09-27 MED ORDER — OXYCODONE-ACETAMINOPHEN 5-325 MG PO TABS: 1.0000 | ORAL_TABLET | Freq: Four times a day (QID) | ORAL | 0 refills | Status: DC | PRN

## 2023-09-27 MED ORDER — ROSUVASTATIN CALCIUM 20 MG PO TABS: 20.0000 mg | ORAL_TABLET | Freq: Every day | ORAL | 3 refills | Status: AC
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
## 2023-09-28 ENCOUNTER — Encounter (HOSPITAL_COMMUNITY): Payer: Self-pay

## 2023-09-28 ENCOUNTER — Emergency Department (HOSPITAL_COMMUNITY)

## 2023-09-28 ENCOUNTER — Emergency Department (HOSPITAL_COMMUNITY)
Admission: EM | Admit: 2023-09-28 | Discharge: 2023-09-28 | Disposition: A | Discharge status: Home / Self Care | Attending: Emergency Medicine | Admitting: Emergency Medicine

## 2023-09-28 ENCOUNTER — Other Ambulatory Visit: Payer: Self-pay

## 2023-09-28 DIAGNOSIS — Y92410 Unspecified street and highway as the place of occurrence of the external cause: Secondary | ICD-10-CM | POA: Insufficient documentation

## 2023-09-28 DIAGNOSIS — M542 Cervicalgia: Secondary | ICD-10-CM | POA: Diagnosis present

## 2023-09-28 DIAGNOSIS — R609 Edema, unspecified: Secondary | ICD-10-CM | POA: Diagnosis not present

## 2023-09-28 DIAGNOSIS — S12001D Unspecified nondisplaced fracture of first cervical vertebra, subsequent encounter for fracture with routine healing: Secondary | ICD-10-CM | POA: Insufficient documentation

## 2023-09-28 DIAGNOSIS — R519 Headache, unspecified: Secondary | ICD-10-CM | POA: Diagnosis not present

## 2023-09-28 DIAGNOSIS — S0990XA Unspecified injury of head, initial encounter: Secondary | ICD-10-CM | POA: Diagnosis not present

## 2023-09-28 DIAGNOSIS — Z853 Personal history of malignant neoplasm of breast: Secondary | ICD-10-CM | POA: Insufficient documentation

## 2023-09-28 DIAGNOSIS — M47812 Spondylosis without myelopathy or radiculopathy, cervical region: Secondary | ICD-10-CM | POA: Diagnosis not present

## 2023-09-28 DIAGNOSIS — Z79899 Other long term (current) drug therapy: Secondary | ICD-10-CM | POA: Diagnosis not present

## 2023-09-28 DIAGNOSIS — I10 Essential (primary) hypertension: Secondary | ICD-10-CM | POA: Insufficient documentation

## 2023-09-28 DIAGNOSIS — S12000D Unspecified displaced fracture of first cervical vertebra, subsequent encounter for fracture with routine healing: Secondary | ICD-10-CM | POA: Diagnosis not present

## 2023-09-28 DIAGNOSIS — R270 Ataxia, unspecified: Secondary | ICD-10-CM | POA: Insufficient documentation

## 2023-09-28 DIAGNOSIS — S129XXD Fracture of neck, unspecified, subsequent encounter: Secondary | ICD-10-CM

## 2023-09-28 HISTORY — DX: Essential (primary) hypertension: I10

## 2023-09-28 LAB — CBC WITH DIFFERENTIAL/PLATELET
Abs Immature Granulocytes: 0.04 10*3/uL (ref 0.00–0.07)
Basophils Absolute: 0.1 10*3/uL (ref 0.0–0.1)
Basophils Relative: 1 %
Eosinophils Absolute: 0.3 10*3/uL (ref 0.0–0.5)
Eosinophils Relative: 3 %
HCT: 38.8 % (ref 36.0–46.0)
Hemoglobin: 11.6 g/dL — ABNORMAL LOW (ref 12.0–15.0)
Immature Granulocytes: 1 %
Lymphocytes Relative: 13 %
Lymphs Abs: 1.1 10*3/uL (ref 0.7–4.0)
MCH: 26.8 pg (ref 26.0–34.0)
MCHC: 29.9 g/dL — ABNORMAL LOW (ref 30.0–36.0)
MCV: 89.6 fL (ref 80.0–100.0)
Monocytes Absolute: 0.5 10*3/uL (ref 0.1–1.0)
Monocytes Relative: 6 %
Neutro Abs: 6.1 10*3/uL (ref 1.7–7.7)
Neutrophils Relative %: 76 %
Platelets: 302 10*3/uL (ref 150–400)
RBC: 4.33 MIL/uL (ref 3.87–5.11)
RDW: 15.7 % — ABNORMAL HIGH (ref 11.5–15.5)
WBC: 8 10*3/uL (ref 4.0–10.5)
nRBC: 0 % (ref 0.0–0.2)

## 2023-09-28 LAB — BASIC METABOLIC PANEL WITH GFR
Anion gap: 9 (ref 5–15)
BUN: 22 mg/dL (ref 8–23)
CO2: 25 mmol/L (ref 22–32)
Calcium: 9.2 mg/dL (ref 8.9–10.3)
Chloride: 102 mmol/L (ref 98–111)
Creatinine, Ser: 0.99 mg/dL (ref 0.44–1.00)
GFR, Estimated: >60 mL/min (ref >60)
Glucose, Bld: 92 mg/dL (ref 70–99)
Potassium: 4.7 mmol/L (ref 3.5–5.1)
Sodium: 136 mmol/L (ref 135–145)

## 2023-09-28 MED ORDER — GADOBUTROL 1 MMOL/ML IV SOLN
10.0000 mL | Freq: Once | INTRAVENOUS | Status: AC | PRN
  Administered 2023-09-28: 10 mL via INTRAVENOUS

## 2023-09-28 MED ORDER — OXYCODONE HCL 5 MG PO TABS
5.0000 mg | ORAL_TABLET | Freq: Once | ORAL | Status: AC
  Administered 2023-09-28: 5 mg via ORAL
  Filled 2023-09-28: qty 1

## 2023-09-28 MED ORDER — ACETAMINOPHEN 500 MG PO TABS
1000.0000 mg | ORAL_TABLET | Freq: Once | ORAL | Status: AC
  Administered 2023-09-28: 1000 mg via ORAL
  Filled 2023-09-28: qty 2

## 2023-09-28 MED ORDER — ONDANSETRON 4 MG PO TBDP
4.0000 mg | ORAL_TABLET | Freq: Once | ORAL | Status: AC
  Administered 2023-09-28: 4 mg via ORAL
  Filled 2023-09-28: qty 1
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
## 2023-09-28 NOTE — Discharge Instructions (Addendum)
 Thank you for coming to [facility] Emergency Department. You were seen for neck pain. We did an MRI which showed swelling around the fractures of C1, C2, and C3. Please continue to wear your C-collar and take your percocet as were prescribed yesterday. Please follow up with your neurosurgeon as originally scheduled next Friday.   Do not hesitate to return to the ED or call 911 if you experience: -Worsening symptoms -Numbness/tingling -Weakness of the arms or breathing muscles -Lightheadedness, passing out -Fevers/chills -Anything else that concerns you
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
## 2023-09-29 ENCOUNTER — Telehealth: Payer: Self-pay

## 2023-09-29 NOTE — Telephone Encounter (Signed)
 FMLA ppw was faxed in for pt. PPW has been placed in nurse's folder to be completed by pcp. Please advise FMLA ppw will need to be picked up by pt once completed cannot be faxed. Please call pt when available for pick up.    CB#: [phone]

## 2023-10-03 ENCOUNTER — Other Ambulatory Visit: Payer: Self-pay | Admitting: Family Medicine

## 2023-10-03 DIAGNOSIS — F32A Depression, unspecified: Secondary | ICD-10-CM

## 2023-10-03 DIAGNOSIS — Z1231 Encounter for screening mammogram for malignant neoplasm of breast: Secondary | ICD-10-CM

## 2023-10-03 NOTE — Telephone Encounter (Signed)
 Copied from CRM [id number]. Topic: Clinical - Medication Refill >> Oct 03, 2023 10:00 AM Christina Phillips S wrote: Medication: oxyCODONE -acetaminophen  (PERCOCET) 5-325 MG tablet  Has the patient contacted their pharmacy? Yes (Agent: If no, request that the patient contact the pharmacy for the refill. If patient does not wish to contact the pharmacy document the reason why and proceed with request.) (Agent: If yes, when and what did the pharmacy advise?)Call provider  This is the patient's preferred pharmacy:  [id number] - [place], [place] - [address] AT CORNER OF [facility] [address] [place] [address] Phone: [phone] Fax: [phone]  Is this the correct pharmacy for this prescription? Yes If no, delete pharmacy and type the correct one.   Has the prescription been filled recently? No  Is the patient out of the medication? Yes  Has the patient been seen for an appointment in the last year OR does the patient have an upcoming appointment? Yes  Can we respond through MyChart? Yes  Agent: Please be advised that Rx refills may take up to 3 business days. We ask that you follow-up with your pharmacy.

## 2023-10-03 NOTE — Telephone Encounter (Signed)
 Prescription Request  10/03/2023  LOV: 09/27/2023  What is the name of the medication or equipment? FLUoxetine  (PROZAC ) 20 MG capsule   Have you contacted your pharmacy to request a refill? Yes   Which pharmacy would you like this sent to?  [id number] - [place], [place] - [address] AT CORNER OF [facility] [address] [place] [address] Phone: [phone] Fax: [phone]    Patient notified that their request is being sent to the clinical staff for review and that they should receive a response within 2 business days.   Please advise at [employer] [phone]

## 2023-10-04 ENCOUNTER — Ambulatory Visit: Payer: Medicare HMO | Admitting: Podiatry

## 2023-10-05 MED ORDER — FLUOXETINE HCL 20 MG PO CAPS: 20.0000 mg | ORAL_CAPSULE | Freq: Every day | ORAL | 1 refills | Status: DC

## 2023-10-05 NOTE — Telephone Encounter (Signed)
 LOV 09/27/2023    Requested Prescriptions  Pending Prescriptions Disp Refills   FLUoxetine  (PROZAC ) 20 MG capsule 90 capsule 1    Sig: Take 1 capsule (20 mg total) by mouth daily.     Psychiatry:  Antidepressants - SSRI Failed - 10/05/2023 10:54 AM      Failed - Valid encounter within last 6 months    Recent Outpatient Visits           1 week ago Benign essential HTN   [place] [facility] Medicine Cheril Cork, Cisco Crest, MD   8 months ago Impacted cerumen of right ear   [place] [facility] Medicine Austine Lefort, MD   9 months ago Bilateral hearing loss, unspecified hearing loss type   [place] [facility] Medicine Austine Lefort, MD   1 year ago Screening cholesterol level   [place] [facility] Family Medicine Austine Lefort, MD   1 year ago Benign essential HTN   [place] [facility] Family Medicine Pickard, Cisco Crest, MD

## 2023-10-05 NOTE — Telephone Encounter (Signed)
 Requested medication (s) are due for refill today: yes  Requested medication (s) are on the active medication list: yes  Last refill:  09/27/23 #10/0  Future visit scheduled: no  Notes to clinic:  Unable to refill per protocol, cannot delegate.      Requested Prescriptions  Pending Prescriptions Disp Refills   oxyCODONE -acetaminophen  (PERCOCET) 5-325 MG tablet 10 tablet 0    Sig: Take 1 tablet by mouth every 6 (six) hours as needed.     Not Delegated - Analgesics:  Opioid Agonist Combinations Failed - 10/05/2023 10:22 AM      Failed - This refill cannot be delegated      Failed - Urine Drug Screen completed in last 360 days      Failed - Valid encounter within last 3 months    Recent Outpatient Visits           1 week ago Benign essential HTN   [place] [facility] Cheril Cork, Cisco Crest, MD   8 months ago Impacted cerumen of right ear   [place] [facility] Medicine Austine Lefort, MD   9 months ago Bilateral hearing loss, unspecified hearing loss type   [place] [facility] Medicine Austine Lefort, MD   1 year ago Screening cholesterol level   [place] [facility] Family Medicine Austine Lefort, MD   1 year ago Benign essential HTN   [place] [facility] Family Medicine Pickard, Cisco Crest, MD

## 2023-10-06 MED ORDER — OXYCODONE-ACETAMINOPHEN 5-325 MG PO TABS: 1.0000 | ORAL_TABLET | Freq: Four times a day (QID) | ORAL | 0 refills | Status: AC | PRN

## 2023-10-07 DIAGNOSIS — Z6841 Body Mass Index (BMI) 40.0 and over, adult: Secondary | ICD-10-CM | POA: Diagnosis not present

## 2023-10-07 DIAGNOSIS — S12090A Other displaced fracture of first cervical vertebra, initial encounter for closed fracture: Secondary | ICD-10-CM | POA: Diagnosis not present

## 2023-10-19 ENCOUNTER — Ambulatory Visit

## 2023-10-21 DIAGNOSIS — M6281 Muscle weakness (generalized): Secondary | ICD-10-CM | POA: Diagnosis not present

## 2023-10-24 DIAGNOSIS — M6281 Muscle weakness (generalized): Secondary | ICD-10-CM | POA: Diagnosis not present

## 2023-10-28 ENCOUNTER — Ambulatory Visit

## 2023-10-31 ENCOUNTER — Ambulatory Visit
Admission: RE | Admit: 2023-10-31 | Discharge: 2023-10-31 | Disposition: A | Discharge status: Home / Self Care | Source: Ambulatory Visit | Attending: Family Medicine | Admitting: Family Medicine

## 2023-10-31 DIAGNOSIS — M6281 Muscle weakness (generalized): Secondary | ICD-10-CM | POA: Diagnosis not present

## 2023-10-31 DIAGNOSIS — Z1231 Encounter for screening mammogram for malignant neoplasm of breast: Secondary | ICD-10-CM

## 2023-11-04 DIAGNOSIS — M6281 Muscle weakness (generalized): Secondary | ICD-10-CM | POA: Diagnosis not present

## 2023-11-07 DIAGNOSIS — M6281 Muscle weakness (generalized): Secondary | ICD-10-CM | POA: Diagnosis not present

## 2023-11-11 DIAGNOSIS — S12090D Other displaced fracture of first cervical vertebra, subsequent encounter for fracture with routine healing: Secondary | ICD-10-CM | POA: Diagnosis not present

## 2023-11-11 DIAGNOSIS — M6281 Muscle weakness (generalized): Secondary | ICD-10-CM | POA: Diagnosis not present

## 2023-11-11 DIAGNOSIS — Z6839 Body mass index (BMI) 39.0-39.9, adult: Secondary | ICD-10-CM | POA: Diagnosis not present

## 2023-11-11 DIAGNOSIS — S12090A Other displaced fracture of first cervical vertebra, initial encounter for closed fracture: Secondary | ICD-10-CM | POA: Diagnosis not present

## 2023-11-14 ENCOUNTER — Other Ambulatory Visit: Payer: Self-pay | Admitting: Family Medicine

## 2023-11-14 DIAGNOSIS — I10 Essential (primary) hypertension: Secondary | ICD-10-CM

## 2023-11-14 DIAGNOSIS — M6281 Muscle weakness (generalized): Secondary | ICD-10-CM | POA: Diagnosis not present

## 2023-11-14 NOTE — Telephone Encounter (Unsigned)
 Copied from CRM [id number]. Topic: Clinical - Medication Refill >> Nov 14, 2023 11:38 AM Everette C wrote: Medication: naproxen  (NAPROSYN ) 375 MG tablet  losartan  (COZAAR ) 50 MG tablet   Has the patient contacted their pharmacy? Yes (Agent: If no, request that the patient contact the pharmacy for the refill. If patient does not wish to contact the pharmacy document the reason why and proceed with request.) (Agent: If yes, when and what did the pharmacy advise?)  This is the patient's preferred pharmacy:  [id number] [place], [place] - [address] AT CORNER OF [facility] [address] [place] [address] Phone: [phone] Fax: [phone]  Is this the correct pharmacy for this prescription? Yes If no, delete pharmacy and type the correct one.   Has the prescription been filled recently? Yes  Is the patient out of the medication? No  Has the patient been seen for an appointment in the last year OR does the patient have an upcoming appointment? Yes  Can we respond through MyChart? No  Agent: Please be advised that Rx refills may take up to 3 business days. We ask that you follow-up with your pharmacy.

## 2023-11-16 MED ORDER — LOSARTAN POTASSIUM 50 MG PO TABS: 50.0000 mg | ORAL_TABLET | Freq: Every day | ORAL | 1 refills | Status: DC

## 2023-11-16 MED ORDER — NAPROXEN 375 MG PO TABS: 375.0000 mg | ORAL_TABLET | Freq: Two times a day (BID) | ORAL | 1 refills | Status: DC

## 2023-11-16 NOTE — Telephone Encounter (Signed)
 Requested Prescriptions  Pending Prescriptions Disp Refills   naproxen  (NAPROSYN ) 375 MG tablet 180 tablet 1    Sig: Take 1 tablet (375 mg total) by mouth 2 (two) times daily.     Analgesics:  NSAIDS Failed - 11/16/2023 10:09 AM      Failed - Manual Review: Labs are only required if the patient has taken medication for more than 8 weeks.      Failed - HGB in normal range and within 360 days    Hemoglobin  Date Value Ref Range Status  09/28/2023 11.6 (L) 12.0 - 15.0 g/dL Final         Passed - Cr in normal range and within 360 days    Creat  Date Value Ref Range Status  09/15/2023 1.04 (H) 0.60 - 1.00 mg/dL Final   Creatinine, Ser  Date Value Ref Range Status  09/28/2023 0.99 0.44 - 1.00 mg/dL Final         Passed - PLT in normal range and within 360 days    Platelets  Date Value Ref Range Status  09/28/2023 302 150 - 400 K/uL Final         Passed - HCT in normal range and within 360 days    HCT  Date Value Ref Range Status  09/28/2023 38.8 36.0 - 46.0 % Final         Passed - eGFR is 30 or above and within 360 days    GFR, Est African American  Date Value Ref Range Status  03/03/2020 82 > OR = 60 mL/min/1.42m2 Final   GFR, Est Non African American  Date Value Ref Range Status  03/03/2020 71 > OR = 60 mL/min/1.84m2 Final   GFR, Estimated  Date Value Ref Range Status  09/28/2023 >60 >60 mL/min Final    Comment:    (NOTE) Calculated using the CKD-EPI Creatinine Equation (2021)    eGFR  Date Value Ref Range Status  12/04/2020 78 > OR = 60 mL/min/1.85m2 Final    Comment:    The eGFR is based on the CKD-EPI 2021 equation. To calculate  the new eGFR from a previous Creatinine or Cystatin C result, go to https://www.kidney.org/professionals/ kdoqi/gfr%5Fcalculator          Passed - Patient is not pregnant      Passed - Valid encounter within last 12 months    Recent Outpatient Visits           1 month ago Benign essential HTN   [place] [facility]  Family Medicine Pickard, Butler DASEN, MD   9 months ago Impacted cerumen of right ear   [place] [facility] Family Medicine Duanne Butler DASEN, MD   10 months ago Bilateral hearing loss, unspecified hearing loss type   [place] [facility] Medicine Duanne Butler DASEN, MD   1 year ago Screening cholesterol level   [place] [facility] Medicine Duanne Butler DASEN, MD   1 year ago Benign essential HTN   [place] [facility] Family Medicine Pickard, Butler DASEN, MD               losartan  (COZAAR ) 50 MG tablet 90 tablet 1    Sig: Take 1 tablet (50 mg total) by mouth daily.     Cardiovascular:  Angiotensin Receptor Blockers Failed - 11/16/2023 10:09 AM      Failed - Last BP in normal range    BP Readings from Last 1 Encounters:  09/28/23 (!) 156/78  Passed - Cr in normal range and within 180 days    Creat  Date Value Ref Range Status  09/15/2023 1.04 (H) 0.60 - 1.00 mg/dL Final   Creatinine, Ser  Date Value Ref Range Status  09/28/2023 0.99 0.44 - 1.00 mg/dL Final         Passed - K in normal range and within 180 days    Potassium  Date Value Ref Range Status  09/28/2023 4.7 3.5 - 5.1 mmol/L Final         Passed - Patient is not pregnant      Passed - Valid encounter within last 6 months    Recent Outpatient Visits           1 month ago Benign essential HTN   [place] [facility] Family Medicine Pickard, Butler DASEN, MD   9 months ago Impacted cerumen of right ear   [place] [facility] Family Medicine Duanne Butler DASEN, MD   10 months ago Bilateral hearing loss, unspecified hearing loss type   [place] [facility] Family Medicine Duanne Butler DASEN, MD   1 year ago Screening cholesterol level   [place] [facility] Medicine Pickard, Butler DASEN, MD   1 year ago Benign essential HTN   [place] [facility] Medicine Pickard, Butler DASEN, MD

## 2023-11-18 DIAGNOSIS — M6281 Muscle weakness (generalized): Secondary | ICD-10-CM | POA: Diagnosis not present

## 2023-11-21 DIAGNOSIS — M6281 Muscle weakness (generalized): Secondary | ICD-10-CM | POA: Diagnosis not present

## 2023-11-25 DIAGNOSIS — M6281 Muscle weakness (generalized): Secondary | ICD-10-CM | POA: Diagnosis not present

## 2023-11-28 DIAGNOSIS — M6281 Muscle weakness (generalized): Secondary | ICD-10-CM | POA: Diagnosis not present

## 2023-12-02 DIAGNOSIS — M6281 Muscle weakness (generalized): Secondary | ICD-10-CM | POA: Diagnosis not present

## 2023-12-05 ENCOUNTER — Telehealth: Payer: Self-pay | Admitting: Family Medicine

## 2023-12-05 ENCOUNTER — Telehealth: Payer: Self-pay

## 2023-12-05 DIAGNOSIS — M6281 Muscle weakness (generalized): Secondary | ICD-10-CM | POA: Diagnosis not present

## 2023-12-05 NOTE — Telephone Encounter (Signed)
 Left message to return call; need to verify spelling of first name. Received progress note from physical therapist with different spelling.

## 2023-12-05 NOTE — Telephone Encounter (Signed)
 Received in the mail for for [employer] eligibility questionnaire form to be completed by pcp. No return envelope enclosed with form. Please contact pt regarding pick up of form once completed. Form placed in nurse's folder for completion. Please advise.  Cb#: [phone]

## 2023-12-08 NOTE — Telephone Encounter (Addendum)
 Forms completed and successfully faxed to number on form [phone]) with a confirmation time stamp of Jul/24/2025 10:03:07 AM.   No charge as per Ronal Bradley, provider's nurse.  Successfully faxed to [facility] Admin with a confirmation time stamp of Jul/24/25 10:57:51 AM  Spoke with patient to advise; patient requested for originals to be mailed to confirmed address on file.

## 2023-12-09 DIAGNOSIS — M6281 Muscle weakness (generalized): Secondary | ICD-10-CM | POA: Diagnosis not present

## 2023-12-12 DIAGNOSIS — M6281 Muscle weakness (generalized): Secondary | ICD-10-CM | POA: Diagnosis not present

## 2023-12-16 DIAGNOSIS — M6281 Muscle weakness (generalized): Secondary | ICD-10-CM | POA: Diagnosis not present

## 2023-12-19 DIAGNOSIS — M6281 Muscle weakness (generalized): Secondary | ICD-10-CM | POA: Diagnosis not present

## 2023-12-23 DIAGNOSIS — Z6841 Body Mass Index (BMI) 40.0 and over, adult: Secondary | ICD-10-CM | POA: Diagnosis not present

## 2023-12-23 DIAGNOSIS — S12090D Other displaced fracture of first cervical vertebra, subsequent encounter for fracture with routine healing: Secondary | ICD-10-CM | POA: Diagnosis not present

## 2023-12-26 DIAGNOSIS — M6281 Muscle weakness (generalized): Secondary | ICD-10-CM | POA: Diagnosis not present

## 2023-12-30 DIAGNOSIS — M6281 Muscle weakness (generalized): Secondary | ICD-10-CM | POA: Diagnosis not present

## 2024-01-02 DIAGNOSIS — M6281 Muscle weakness (generalized): Secondary | ICD-10-CM | POA: Diagnosis not present

## 2024-01-06 DIAGNOSIS — M6281 Muscle weakness (generalized): Secondary | ICD-10-CM | POA: Diagnosis not present

## 2024-01-09 DIAGNOSIS — M6281 Muscle weakness (generalized): Secondary | ICD-10-CM | POA: Diagnosis not present

## 2024-01-13 DIAGNOSIS — M6281 Muscle weakness (generalized): Secondary | ICD-10-CM | POA: Diagnosis not present

## 2024-01-20 DIAGNOSIS — M6281 Muscle weakness (generalized): Secondary | ICD-10-CM | POA: Diagnosis not present

## 2024-01-23 DIAGNOSIS — M6281 Muscle weakness (generalized): Secondary | ICD-10-CM | POA: Diagnosis not present

## 2024-01-27 DIAGNOSIS — M6281 Muscle weakness (generalized): Secondary | ICD-10-CM | POA: Diagnosis not present

## 2024-01-30 DIAGNOSIS — M6281 Muscle weakness (generalized): Secondary | ICD-10-CM | POA: Diagnosis not present

## 2024-01-31 ENCOUNTER — Ambulatory Visit
Admission: RE | Admit: 2024-01-31 | Discharge: 2024-01-31 | Disposition: A | Discharge status: Home / Self Care | Source: Ambulatory Visit | Attending: Family Medicine | Admitting: Family Medicine

## 2024-01-31 DIAGNOSIS — Z1231 Encounter for screening mammogram for malignant neoplasm of breast: Secondary | ICD-10-CM | POA: Diagnosis not present

## 2024-02-03 DIAGNOSIS — M6281 Muscle weakness (generalized): Secondary | ICD-10-CM | POA: Diagnosis not present

## 2024-02-06 DIAGNOSIS — M6281 Muscle weakness (generalized): Secondary | ICD-10-CM | POA: Diagnosis not present

## 2024-02-07 ENCOUNTER — Ambulatory Visit: Admitting: Podiatry

## 2024-02-07 ENCOUNTER — Encounter: Payer: Self-pay | Admitting: Podiatry

## 2024-02-07 VITALS — Ht 66.0 in | Wt 250.0 lb

## 2024-02-07 DIAGNOSIS — B351 Tinea unguium: Secondary | ICD-10-CM | POA: Diagnosis not present

## 2024-02-07 DIAGNOSIS — M79674 Pain in right toe(s): Secondary | ICD-10-CM | POA: Diagnosis not present

## 2024-02-07 DIAGNOSIS — M79675 Pain in left toe(s): Secondary | ICD-10-CM

## 2024-02-08 NOTE — Progress Notes (Signed)
  Subjective:  Patient ID: Christina Phillips, female    DOB: 1950/06/12,  MRN: [id number]  Christina Phillips presents to clinic today for painful thick toenails that are difficult to trim. Pain interferes with ambulation. Aggravating factors include wearing enclosed shoe gear. Pain is relieved with periodic professional debridement.  Chief Complaint  Patient presents with   Nail Problem    RM 17 RFC. Pt is not diabetic. PCP-Dr. Butler Burr, last visit 09/27/23.   New problem(s): None.   PCP is Burr Butler DASEN, MD.  No Known Allergies  Review of Systems: Negative except as noted in the HPI.  Objective: No changes noted in today's physical examination. There were no vitals filed for this visit. Christina Phillips is a pleasant 73 y.o. female in NAD. AAO x 3.  Vascular Examination: Vascular status intact b/l with palpable pedal pulses. CFT immediate b/l. Pedal hair present. No edema. No pain with calf compression b/l. Skin temperature gradient WNL b/l. No varicosities noted. No cyanosis or clubbing noted.  Neurological Examination: Sensation grossly intact b/l with 10 gram monofilament. Vibratory sensation intact b/l.  Dermatological Examination: Pedal skin with normal turgor, texture and tone b/l. No open wounds nor interdigital macerations noted. Toenails 1-5 b/l thick, discolored, elongated with subungual debris and pain on dorsal palpation. No corns, calluses nor porokeratotic lesions noted.  Musculoskeletal Examination: Muscle strength 5/5 to b/l LE.  No pain, crepitus noted b/l. No gross pedal deformities. Patient ambulates independently without assistive aids.   Radiographs: None  Assessment/Plan: 1. Pain due to onychomycosis of toenails of both feet   Consent given for treatment. Patient examined. All patient's and/or POA's questions/concerns addressed on today's visit. Mycotic toenails 1-5 debrided in length and girth without incident. Continue soft,  supportive shoe gear daily. Report any pedal injuries to medical professional. Call office if there are any quesitons/concerns. -Patient/POA to call should there be question/concern in the interim. Return in about 3 months (around 05/08/2024).  Delon LITTIE Merlin, DPM      [place] LOCATION: [address], [place] [address]                   Office [phone]   [facility] LOCATION: [address] [place], [place] [address] Office [phone]

## 2024-02-10 DIAGNOSIS — M6281 Muscle weakness (generalized): Secondary | ICD-10-CM | POA: Diagnosis not present

## 2024-02-13 DIAGNOSIS — M6281 Muscle weakness (generalized): Secondary | ICD-10-CM | POA: Diagnosis not present

## 2024-02-17 DIAGNOSIS — M6281 Muscle weakness (generalized): Secondary | ICD-10-CM | POA: Diagnosis not present

## 2024-02-20 DIAGNOSIS — M6281 Muscle weakness (generalized): Secondary | ICD-10-CM | POA: Diagnosis not present

## 2024-02-24 DIAGNOSIS — M6281 Muscle weakness (generalized): Secondary | ICD-10-CM | POA: Diagnosis not present

## 2024-02-27 DIAGNOSIS — M6281 Muscle weakness (generalized): Secondary | ICD-10-CM | POA: Diagnosis not present

## 2024-03-02 DIAGNOSIS — M6281 Muscle weakness (generalized): Secondary | ICD-10-CM | POA: Diagnosis not present

## 2024-03-05 DIAGNOSIS — M6281 Muscle weakness (generalized): Secondary | ICD-10-CM | POA: Diagnosis not present

## 2024-03-09 DIAGNOSIS — M6281 Muscle weakness (generalized): Secondary | ICD-10-CM | POA: Diagnosis not present

## 2024-03-12 DIAGNOSIS — M6281 Muscle weakness (generalized): Secondary | ICD-10-CM | POA: Diagnosis not present

## 2024-03-23 DIAGNOSIS — M6281 Muscle weakness (generalized): Secondary | ICD-10-CM | POA: Diagnosis not present

## 2024-03-28 ENCOUNTER — Other Ambulatory Visit: Payer: Self-pay | Admitting: Family Medicine

## 2024-03-28 DIAGNOSIS — F32A Depression, unspecified: Secondary | ICD-10-CM

## 2024-03-29 ENCOUNTER — Ambulatory Visit: Admitting: Family Medicine

## 2024-03-29 NOTE — Telephone Encounter (Signed)
 Requested Prescriptions  Pending Prescriptions Disp Refills   FLUoxetine  (PROZAC ) 20 MG capsule [Pharmacy Med Name: FLUOXETINE  HCL 20 MG CAPSULE] 90 capsule 0    Sig: TAKE 1 CAPSULE BY MOUTH EVERY DAY     Psychiatry:  Antidepressants - SSRI Failed - 03/29/2024  4:04 PM      Failed - Valid encounter within last 6 months    Recent Outpatient Visits           6 months ago Benign essential HTN   [place] [facility] Medicine Duanne, Butler DASEN, MD   1 year ago Impacted cerumen of right ear   [place] [facility] Family Medicine Duanne Butler DASEN, MD   1 year ago Bilateral hearing loss, unspecified hearing loss type   [place] [facility] Medicine Duanne Butler DASEN, MD   1 year ago Screening cholesterol level   [place] [facility] Family Medicine Pickard, Butler DASEN, MD   1 year ago Benign essential HTN   [place] [facility] Medicine Pickard, Butler DASEN, MD

## 2024-04-02 DIAGNOSIS — M6281 Muscle weakness (generalized): Secondary | ICD-10-CM | POA: Diagnosis not present

## 2024-04-03 ENCOUNTER — Ambulatory Visit: Admitting: Family Medicine

## 2024-04-06 DIAGNOSIS — M6281 Muscle weakness (generalized): Secondary | ICD-10-CM | POA: Diagnosis not present

## 2024-04-09 DIAGNOSIS — M6281 Muscle weakness (generalized): Secondary | ICD-10-CM | POA: Diagnosis not present

## 2024-04-13 DIAGNOSIS — M6281 Muscle weakness (generalized): Secondary | ICD-10-CM | POA: Diagnosis not present

## 2024-04-16 DIAGNOSIS — M6281 Muscle weakness (generalized): Secondary | ICD-10-CM | POA: Diagnosis not present

## 2024-04-20 DIAGNOSIS — M6281 Muscle weakness (generalized): Secondary | ICD-10-CM | POA: Diagnosis not present

## 2024-04-23 DIAGNOSIS — M6281 Muscle weakness (generalized): Secondary | ICD-10-CM | POA: Diagnosis not present

## 2024-05-01 ENCOUNTER — Ambulatory Visit (INDEPENDENT_AMBULATORY_CARE_PROVIDER_SITE_OTHER): Payer: Medicare HMO | Admitting: Audiology

## 2024-05-19 ENCOUNTER — Other Ambulatory Visit: Payer: Self-pay | Admitting: Family Medicine

## 2024-05-19 DIAGNOSIS — I10 Essential (primary) hypertension: Secondary | ICD-10-CM

## 2024-05-23 ENCOUNTER — Encounter: Payer: Self-pay | Admitting: Podiatry

## 2024-05-23 ENCOUNTER — Encounter (INDEPENDENT_AMBULATORY_CARE_PROVIDER_SITE_OTHER): Payer: Self-pay

## 2024-05-23 ENCOUNTER — Ambulatory Visit: Admitting: Podiatry

## 2024-05-23 DIAGNOSIS — M79675 Pain in left toe(s): Secondary | ICD-10-CM | POA: Diagnosis not present

## 2024-05-23 DIAGNOSIS — M79674 Pain in right toe(s): Secondary | ICD-10-CM | POA: Diagnosis not present

## 2024-05-23 DIAGNOSIS — B351 Tinea unguium: Secondary | ICD-10-CM | POA: Diagnosis not present

## 2024-05-27 NOTE — Progress Notes (Signed)
"  °  Subjective:  Patient ID: Christina Phillips, female    DOB: 17-Apr-1951,  MRN: [id number]  Christina Phillips presents to clinic today for painful mycotic toenails of both feet that are difficult to trim. Pain interferes with daily activities and wearing enclosed shoe gear comfortably.  Chief Complaint  Patient presents with   [facility]    Rm16 routine foot care/ Dr. Butler Burr last visit January 2025    New problem(s): None.   PCP is Burr Butler DASEN, MD.  Allergies[1]  Review of Systems: Negative except as noted in the HPI.  Objective: No changes noted in today's physical examination. There were no vitals filed for this visit. Christina Phillips is a pleasant 74 y.o. female in NAD. AAO x 3.  Vascular Examination: Vascular status intact b/l with palpable pedal pulses. CFT immediate b/l. Pedal hair present. No edema. No pain with calf compression b/l. Skin temperature gradient WNL b/l. No varicosities noted. No cyanosis or clubbing noted.  Neurological Examination: Sensation grossly intact b/l with 10 gram monofilament. Vibratory sensation intact b/l.  Dermatological Examination: Pedal skin with normal turgor, texture and tone b/l. No open wounds nor interdigital macerations noted. Toenails 1-5 b/l thick, discolored, elongated with subungual debris and pain on dorsal palpation. No corns, calluses nor porokeratotic lesions noted.  Musculoskeletal Examination: Muscle strength 5/5 to b/l LE.  No pain, crepitus noted b/l. No gross pedal deformities. Utilizes rollator for ambulation assistance.  Radiographs: None  Assessment/Plan: 1. Pain due to onychomycosis of toenails of both feet   Patient was evaluated and treated. All patient's and/or POA's questions/concerns addressed on today's visit. Toenails 1-5 b/l debrided in length and girth without incident. Continue soft, supportive shoe gear daily. Report any pedal injuries to medical professional. Call office if there  are any questions/concerns. -Patient/POA to call should there be question/concern in the interim.   Return in about 3 months (around 08/21/2024).  Delon LITTIE Merlin, DPM      [place] LOCATION: [address], [place] [address]                   Office [phone]   [facility] LOCATION: [address] [place], [place] [address] Office [phone]     [1] No Known Allergies  "

## 2024-05-28 ENCOUNTER — Ambulatory Visit (INDEPENDENT_AMBULATORY_CARE_PROVIDER_SITE_OTHER): Admitting: Audiology

## 2024-05-28 DIAGNOSIS — H903 Sensorineural hearing loss, bilateral: Secondary | ICD-10-CM | POA: Diagnosis not present

## 2024-05-28 NOTE — Progress Notes (Signed)
" °  [address], [address] [place], [place] [address] [phone]  Audiological Evaluation    Name: Christina Phillips     DOB:   [birth year]      MRN:   [id number]                                                                                     Service Date: 05/28/2024     Accompanied by: self    Patient comes today after Dr. Tobie, ENT sent a referral for a hearing evaluation due to concerns with right hearing loss asymmetry.   Symptoms Yes Details  Hearing loss  [x]  Right loss worse than left - over the years 01-27-23: Speech Recognition Thresholds were  60dB in the right ear and 50dB in the left ear. Word Recognition was performed 40dB SL, scored 56% in the right ear and 96% in the left ear. Pure tone thresholds show asymmetric sensorineural hearing loss with the right ear worse.   Tinnitus  []    Ear pain/ infections/pressure  []    Balance problems  [x]  Reports some spinning at some point  because she ate too much salt.  Noise exposure history  []    Previous ear surgeries  []    Family history of hearing loss  []    Amplification  [x]  Has a set of hearing aids, maybe was fit a hearing solutions.  Other  []      Otoscopy: Right ear: Clear external ear canal and notable landmarks visualized on the tympanic membrane. Left ear:  Clear external ear canal and notable landmarks visualized on the tympanic membrane.  Tympanometry: Right ear: Type A - Normal external ear canal volume with normal middle ear pressure and normal tympanic membrane compliance. Findings are consistent with normal middle ear function. Left ear: Type A - Normal external ear canal volume with normal middle ear pressure and normal tympanic membrane compliance. Findings are consistent with normal middle ear function.  Hearing Evaluation The hearing test results were completed under inserts and re-checked with headphones  and results are deemed to be of good reliability. Test technique:  conventional     Pure tone Audiometry: Right ear- Mild to severe sensorineural hearing loss from 125 Hz - 8000 Hz. Left ear-  Borderline normal to moderately severe sensorineural hearing loss from 125 Hz - 8000 Hz.  Speech Audiometry: Right ear- Speech Reception Threshold (SRT) was obtained at 60 dBHL, with contralateral masking. Left ear-Speech Reception Threshold (SRT) was obtained at 30 dBHL.   Word Recognition Score Tested using NU-6 (recorded) Right ear: 72% was obtained at a presentation level of 95 dBHL with contralateral masking which is deemed as  fair. Left ear: 96% was obtained at a presentation level of 75 dBHL with contralateral masking which is deemed as  excellent.   Impression: There is a significant difference in word recognition scores and pure tone thresholds, worse in the right ear.     Recommendations: Follow up with ENT as needed. Return for a hearing evaluation if concerns with hearing changes arise or per MD recommendation. Hearing aid use after medical clearance.   Magin Balbi MARIE LEROUX-MARTINEZ, AUD  "

## 2024-08-29 ENCOUNTER — Ambulatory Visit: Admitting: Podiatry

## 2024-09-27 ENCOUNTER — Encounter: Admitting: Family Medicine
